# Patient Record
Sex: Female | Born: 1975 | Hispanic: Yes | Marital: Married | Smoking: Never smoker
Health system: Southern US, Community
[De-identification: ages and names within clinical notes are randomized; demographics above are authoritative.]

## PROBLEM LIST (undated history)

## (undated) DIAGNOSIS — N390 Urinary tract infection, site not specified: Secondary | ICD-10-CM

## (undated) DIAGNOSIS — O24419 Gestational diabetes mellitus in pregnancy, unspecified control: Secondary | ICD-10-CM

## (undated) DIAGNOSIS — J40 Bronchitis, not specified as acute or chronic: Secondary | ICD-10-CM

## (undated) HISTORY — PX: TUBAL LIGATION: SHX77

## (undated) HISTORY — DX: Gestational diabetes mellitus in pregnancy, unspecified control: O24.419

## (undated) HISTORY — DX: Urinary tract infection, site not specified: N39.0

---

## 2017-04-07 ENCOUNTER — Emergency Department
Admission: EM | Admit: 2017-04-07 | Discharge: 2017-04-07 | Disposition: A | Discharge status: Home / Self Care | Payer: Self-pay | Source: Home / Self Care | Attending: Family Medicine | Admitting: Family Medicine

## 2017-04-07 DIAGNOSIS — R053 Chronic cough: Secondary | ICD-10-CM

## 2017-04-07 DIAGNOSIS — R05 Cough: Secondary | ICD-10-CM

## 2017-04-07 DIAGNOSIS — R062 Wheezing: Secondary | ICD-10-CM

## 2017-04-07 DIAGNOSIS — J9801 Acute bronchospasm: Secondary | ICD-10-CM

## 2017-04-07 MED ORDER — PREDNISONE 50 MG PO TABS
60.0000 mg | ORAL_TABLET | Freq: Once | ORAL | Status: AC
  Administered 2017-04-07: 60 mg via ORAL

## 2017-04-07 MED ORDER — ALBUTEROL SULFATE HFA 108 (90 BASE) MCG/ACT IN AERS: 1.0000 | INHALATION_SPRAY | Freq: Four times a day (QID) | RESPIRATORY_TRACT | 0 refills | Status: DC | PRN

## 2017-04-07 MED ORDER — IPRATROPIUM-ALBUTEROL 0.5-2.5 (3) MG/3ML IN SOLN
3.0000 mL | Freq: Once | RESPIRATORY_TRACT | Status: AC
  Administered 2017-04-07: 3 mL via RESPIRATORY_TRACT

## 2017-04-07 MED ORDER — AZITHROMYCIN 250 MG PO TABS: 250.0000 mg | ORAL_TABLET | Freq: Every day | ORAL | 0 refills | Status: DC

## 2017-04-07 MED ORDER — PREDNISONE 20 MG PO TABS: ORAL_TABLET | ORAL | 0 refills | Status: DC

## 2017-04-07 NOTE — ED Triage Notes (Signed)
Pt has had a productive cough x 3 weeks.  Stated that what she coughs up is green and brown.  Also is SOB at times.

## 2017-04-07 NOTE — ED Provider Notes (Signed)
CSN: [id number]     Arrival date & time 04/07/17  1926 History   First MD Initiated Contact with Patient 04/07/17 1944     Chief Complaint  Patient presents with  . Cough  . Fatigue   (Consider location/radiation/quality/duration/timing/severity/associated sxs/prior Treatment) HPI  Victoria Russo is a 41 y.o. female presenting to UC with family member with c/o gradually worsening productive cough for 3 weeks.  Sputum production is green/brown in color. Mild intermittent SOB, worse with exertion such as running.  No known hx of asthma. No sick contacts or recent travel. She has not tried any OTC medications. Denies fever, chills, n/v/d.    History reviewed. No pertinent past medical history. History reviewed. No pertinent surgical history. Family History  Problem Relation Age of Onset  . Cancer Mother    Social History  Substance Use Topics  . Smoking status: Never Smoker  . Smokeless tobacco: Never Used  . Alcohol use Yes   OB History    No data available     Review of Systems  Constitutional: Negative for chills and fever.  HENT: Positive for congestion, postnasal drip and rhinorrhea. Negative for ear pain, sore throat, trouble swallowing and voice change.   Respiratory: Positive for cough and chest tightness. Negative for shortness of breath.   Cardiovascular: Negative for chest pain and palpitations.  Gastrointestinal: Negative for abdominal pain, diarrhea, nausea and vomiting.  Musculoskeletal: Negative for arthralgias, back pain and myalgias.  Skin: Negative for rash.    Allergies  Patient has no known allergies.  Home Medications   Prior to Admission medications   Medication Sig Start Date End Date Taking? Authorizing Provider  albuterol (PROVENTIL HFA;VENTOLIN HFA) 108 (90 Base) MCG/ACT inhaler Inhale 1-2 puffs into the lungs every 6 (six) hours as needed for wheezing or shortness of breath. 04/07/17   Junius Finner'Malley, Rissie Sculley, PA-C  azithromycin (ZITHROMAX) 250  MG tablet Take 1 tablet (250 mg total) by mouth daily. Take first 2 tablets together, then 1 every day until finished. 04/07/17   Junius Finner'Malley, Taha Dimond, PA-C  predniSONE (DELTASONE) 20 MG tablet 3 tabs po day one, then 2 po daily x 4 days 04/07/17   Junius Finner'Malley, Consepcion Utt, PA-C   Meds Ordered and Administered this Visit   Medications  ipratropium-albuterol (DUONEB) 0.5-2.5 (3) MG/3ML nebulizer solution 3 mL (3 mLs Nebulization Given 04/07/17 1952)  predniSONE (DELTASONE) tablet 60 mg (60 mg Oral Given 04/07/17 2000)    BP 115/76   Pulse 66   Temp 99.4 F (37.4 C) (Oral)   Wt 164 lb (74.4 kg)   LMP 03/21/2017   SpO2 98%  No data found.   Physical Exam  Constitutional: She is oriented to person, place, and time. She appears well-developed and well-nourished. No distress.  HENT:  Head: Normocephalic and atraumatic.  Right Ear: Tympanic membrane normal.  Left Ear: Tympanic membrane normal.  Nose: Mucosal edema present.  Mouth/Throat: Uvula is midline, oropharynx is clear and moist and mucous membranes are normal.  Eyes: EOM are normal.  Neck: Normal range of motion. Neck supple.  Cardiovascular: Normal rate and regular rhythm.   Pulmonary/Chest: Effort normal. No stridor. No respiratory distress. She has wheezes. She has no rales. She exhibits no tenderness.  Musculoskeletal: Normal range of motion.  Lymphadenopathy:    She has no cervical adenopathy.  Neurological: She is alert and oriented to person, place, and time.  Skin: Skin is warm and dry. She is not diaphoretic.  Psychiatric: She has a normal mood and affect. Her  behavior is normal.  Nursing note and vitals reviewed.   Urgent Care Course     Procedures (including critical care time)  Labs Review Labs Reviewed - No data to display  Imaging Review No results found.    MDM   1. Persistent cough   2. Acute bronchospasm   3. Wheeze    Wheeze noted on exam. Duoneb and Prednisone 60mg  PO given Wheeze resolved  Due to duration  of cough, will cover for bacterial cause. Rx: azithromycin, prednisone and albuterol inhaler   F/u with PCP in 1 week if not improving, sooner if worsening.     Junius Finner, PA-C 04/08/17 [phone]

## 2017-07-19 LAB — CYTOLOGY - PAP

## 2017-07-19 LAB — OB RESULTS CONSOLE HGB/HCT, BLOOD
HCT: 38
HEMOGLOBIN: 13.3

## 2017-07-19 LAB — OB RESULTS CONSOLE GC/CHLAMYDIA
CHLAMYDIA, DNA PROBE: NEGATIVE
Gonorrhea: NEGATIVE

## 2017-07-19 LAB — OB RESULTS CONSOLE HIV ANTIBODY (ROUTINE TESTING): HIV: NONREACTIVE

## 2017-07-19 LAB — OB RESULTS CONSOLE PLATELET COUNT: Platelets: 302

## 2017-07-19 LAB — OB RESULTS CONSOLE ANTIBODY SCREEN: Antibody Screen: NEGATIVE

## 2017-07-19 LAB — OB RESULTS CONSOLE RUBELLA ANTIBODY, IGM: RUBELLA: IMMUNE

## 2017-07-19 LAB — CULTURE, OB URINE: URINE CULTURE, OB: NEGATIVE

## 2017-07-19 LAB — OB RESULTS CONSOLE HEPATITIS B SURFACE ANTIGEN: HEP B S AG: NEGATIVE

## 2017-07-19 LAB — OB RESULTS CONSOLE VARICELLA ZOSTER ANTIBODY, IGG: VARICELLA IGG: IMMUNE

## 2017-07-19 LAB — SICKLE CELL SCREEN: SICKLE CELL SCREEN: NORMAL

## 2017-07-19 LAB — OB RESULTS CONSOLE ABO/RH: RH Type: POSITIVE

## 2017-07-19 LAB — OB RESULTS CONSOLE RPR: RPR: NONREACTIVE

## 2017-07-19 LAB — GLUCOSE TOLERANCE, 1 HOUR: GLUCOSE 1 HOUR: 160

## 2017-07-20 LAB — GLUCOSE, 3 HOUR

## 2017-07-27 ENCOUNTER — Encounter: Payer: Self-pay | Admitting: *Deleted

## 2017-08-02 DIAGNOSIS — O0990 Supervision of high risk pregnancy, unspecified, unspecified trimester: Secondary | ICD-10-CM

## 2017-08-02 DIAGNOSIS — N390 Urinary tract infection, site not specified: Secondary | ICD-10-CM | POA: Insufficient documentation

## 2017-08-02 DIAGNOSIS — O24419 Gestational diabetes mellitus in pregnancy, unspecified control: Secondary | ICD-10-CM | POA: Insufficient documentation

## 2017-08-03 ENCOUNTER — Ambulatory Visit: Payer: Self-pay | Admitting: *Deleted

## 2017-08-03 ENCOUNTER — Encounter: Payer: Self-pay | Attending: Obstetrics and Gynecology | Admitting: *Deleted

## 2017-08-03 DIAGNOSIS — R7302 Impaired glucose tolerance (oral): Secondary | ICD-10-CM | POA: Insufficient documentation

## 2017-08-03 DIAGNOSIS — R7309 Other abnormal glucose: Secondary | ICD-10-CM

## 2017-08-03 DIAGNOSIS — Z713 Dietary counseling and surveillance: Secondary | ICD-10-CM | POA: Insufficient documentation

## 2017-08-03 NOTE — Progress Notes (Signed)
  Patient was seen on 08/03/2017 for Gestational Diabetes self-management .She is here with Spanish interpretor.  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 Diet history obtained. She states no history of GDM. EDD: 02/20/2018. The following learning objectives were met by the patient :   States the definition of Gestational Diabetes  States why dietary management is important in controlling blood glucose  Describes the effects of carbohydrates on blood glucose levels  Demonstrates ability to create a balanced meal plan  Demonstrates carbohydrate counting   States when to check blood glucose levels  Demonstrates proper blood glucose monitoring techniques  States the effect of stress and exercise on blood glucose levels  States the importance of limiting caffeine and abstaining from alcohol and smoking  Plan:  Aim for 3 Carb Choices per meal (45 grams) +/- 1 either way  Aim for 1-2 Carbs per snack Begin reading food labels for Total Carbohydrate of foods Consider  increasing your activity level by walking or other activity daily as tolerated Begin checking BG before breakfast and 2 hours after first bite of breakfast, lunch and dinner as directed by MD  Bring Log Book to every medical appointment   Take medication if directed by MD  Blood glucose monitor given: True Track Lot # [id number] Exp: 03/01/2018 Blood glucose reading: 77 mg/dl  Patient instructed to monitor glucose levels: FBS: 60 - 95 mg/dl 2 hour: <120 mg/dl  Patient received the following handouts:  Nutrition Diabetes and Pregnancy  Carbohydrate Counting List  Patient will be seen for follow-up as needed.

## 2017-08-06 ENCOUNTER — Encounter: Payer: Self-pay | Admitting: *Deleted

## 2017-08-10 ENCOUNTER — Ambulatory Visit (INDEPENDENT_AMBULATORY_CARE_PROVIDER_SITE_OTHER): Payer: Self-pay | Admitting: Family Medicine

## 2017-08-10 ENCOUNTER — Encounter: Payer: Self-pay | Admitting: Family Medicine

## 2017-08-10 VITALS — BP 111/63 | HR 70 | Wt 166.1 lb

## 2017-08-10 DIAGNOSIS — O24410 Gestational diabetes mellitus in pregnancy, diet controlled: Secondary | ICD-10-CM

## 2017-08-10 DIAGNOSIS — O0990 Supervision of high risk pregnancy, unspecified, unspecified trimester: Secondary | ICD-10-CM

## 2017-08-10 DIAGNOSIS — O09529 Supervision of elderly multigravida, unspecified trimester: Secondary | ICD-10-CM | POA: Insufficient documentation

## 2017-08-10 DIAGNOSIS — Z23 Encounter for immunization: Secondary | ICD-10-CM

## 2017-08-10 DIAGNOSIS — O09521 Supervision of elderly multigravida, first trimester: Secondary | ICD-10-CM

## 2017-08-10 DIAGNOSIS — O0992 Supervision of high risk pregnancy, unspecified, second trimester: Secondary | ICD-10-CM

## 2017-08-10 DIAGNOSIS — O34219 Maternal care for unspecified type scar from previous cesarean delivery: Secondary | ICD-10-CM

## 2017-08-10 DIAGNOSIS — O09522 Supervision of elderly multigravida, second trimester: Secondary | ICD-10-CM

## 2017-08-10 NOTE — Progress Notes (Signed)
New OB packet given  Albertina Sen[place] --spanish interpreter

## 2017-08-10 NOTE — Progress Notes (Addendum)
[place] shipped via [employer].  Pt given contact information.  [employer] confirmation # [id number]

## 2017-08-11 ENCOUNTER — Telehealth: Payer: Self-pay | Admitting: General Practice

## 2017-08-11 ENCOUNTER — Encounter: Payer: Self-pay | Admitting: Family Medicine

## 2017-08-11 DIAGNOSIS — O24410 Gestational diabetes mellitus in pregnancy, diet controlled: Secondary | ICD-10-CM

## 2017-08-11 MED ORDER — ASPIRIN EC 81 MG PO TBEC: 81.0000 mg | DELAYED_RELEASE_TABLET | Freq: Every day | ORAL | 3 refills | Status: DC

## 2017-08-11 MED ORDER — PRENATAL VITAMINS 0.8 MG PO TABS: 1.0000 | ORAL_TABLET | Freq: Every day | ORAL | 12 refills | Status: DC

## 2017-08-11 NOTE — Progress Notes (Signed)
   PRENATAL VISIT NOTE  Subjective:  Victoria Russo is a 41 y.o. [phone] at 81w3dbeing [facility] today [facility]  She is currently monitored for the following issues for this high-risk pregnancy and has Gestational diabetes mellitus (GDM); Supervision of high risk pregnancy, antepartum; UTI (urinary tract infection); AMA (advanced maternal age) multigravida 35+; and History of cesarean delivery, antepartum on her problem list.  Patient reports no complaints.  Contractions: Not present. Vag. Bleeding: None.   . Denies leaking of fluid.   The following portions of the patient's history were reviewed and updated as appropriate: allergies, current medications, past family history, past medical history, past social history, past surgical history and problem list. Problem list updated.  Objective:   Vitals:   08/10/17 1547  BP: 111/63  Pulse: 70  Weight: 166 lb 1.6 oz (75.3 kg)    Fetal Status: Fetal Heart Rate (bpm): 145         General:  Alert, oriented and cooperative. Patient is in no acute distress.  Skin: Skin is warm and dry. No rash noted.   Cardiovascular: Normal heart rate noted  Respiratory: Normal respiratory effort, no problems with respiration noted  Abdomen: Soft, gravid, appropriate for gestational age.  Pain/Pressure: Absent     Pelvic: Cervical exam deferred        Extremities: Normal range of motion.  Edema: None  Mental Status:  Normal mood and affect. Normal behavior. Normal judgment and thought content.   Assessment and Plan:  Pregnancy: [id number]. Supervision of high risk pregnancy, antepartum Labs and record reviewed  2. Elderly multigravida in first trimester For NIPS - Genetic Screening  3. History of cesarean delivery, antepartum X2--for elective repeat  4. Diet controlled gestational diabetes mellitus (GDM) in second trimester Baseline labs Has met with Diabetes and Nutrition will  check BS Follow diet - Hemoglobin A1c - Protein, urine, 24 hour - TSH - Comprehensive metabolic panel -ASA 81 mg po daily  5. Needs flu shot - Flu Vaccine QUAD 36+ mos IM  General obstetric precautions including but not limited to vaginal bleeding, contractions, leaking of fluid and fetal movement were reviewed in detail with the patient. Please refer to After Visit Summary for other counseling recommendations.    TaDonnamae JudeMD

## 2017-08-11 NOTE — Patient Instructions (Signed)
 Lactancia materna (Breastfeeding) Decidir amamantar es una de las mejores elecciones que puede hacer por usted y su beb. El cambio hormonal durante el embarazo produce el desarrollo del tejido mamario y aumenta la cantidad y el tamao de los conductos galactforos. Estas hormonas tambin permiten que las protenas, los azcares y las grasas de la sangre produzcan la leche materna en las glndulas productoras de leche. Las hormonas impiden que la leche materna sea liberada antes del nacimiento del beb, adems de impulsar el flujo de leche luego del nacimiento. Una vez que ha comenzado a amamantar, pensar en el beb, as como la succin o el llanto, pueden estimular la liberacin de leche de las glndulas productoras de leche. LOS BENEFICIOS DE AMAMANTAR Para el beb  La primera leche (calostro) ayuda a mejorar el funcionamiento del sistema digestivo del beb.  La leche tiene anticuerpos que ayudan a prevenir las infecciones en el beb.  El beb tiene una menor incidencia de asma, alergias y del sndrome de muerte sbita del lactante.  Los nutrientes en la leche materna son mejores para el beb que la leche maternizada y estn preparados exclusivamente para cubrir las necesidades del beb.  La leche materna mejora el desarrollo cerebral del beb.  Es menos probable que el beb desarrolle otras enfermedades, como obesidad infantil, asma o diabetes mellitus de tipo 2. Para usted  La lactancia materna favorece el desarrollo de un vnculo muy especial entre la madre y el beb.  Es conveniente. La leche materna siempre est disponible a la temperatura correcta y es econmica.  La lactancia materna ayuda a quemar caloras y a perder el peso ganado durante el embarazo.  Favorece la contraccin del tero al tamao que tena antes del embarazo de manera ms rpida y disminuye el sangrado (loquios) despus del parto.  La lactancia materna contribuye a reducir el riesgo de desarrollar diabetes  mellitus de tipo 2, osteoporosis o cncer de mama o de ovario en el futuro. SIGNOS DE QUE EL BEB EST HAMBRIENTO Primeros signos de hambre  Aumenta su estado de alerta o actividad.  Se estira.  Mueve la cabeza de un lado a otro.  Mueve la cabeza y abre la boca cuando se le toca la mejilla o la comisura de la boca (reflejo de bsqueda).  Aumenta las vocalizaciones, tales como sonidos de succin, se relame los labios, emite arrullos, suspiros, o chirridos.  Mueve la mano hacia la boca.  Se chupa con ganas los dedos o las manos. Signos tardos de hambre  Est agitado.  Llora de manera intermitente. Signos de hambre extrema Los signos de hambre extrema requerirn que lo calme y lo consuele antes de que el beb pueda alimentarse adecuadamente. No espere a que se manifiesten los siguientes signos de hambre extrema para comenzar a amamantar:  Agitacin.  Llanto intenso y fuerte.  Gritos. INFORMACIN BSICA SOBRE LA LACTANCIA MATERNA Iniciacin de la lactancia materna  Encuentre un lugar cmodo para sentarse o acostarse, con un buen respaldo para el cuello y la espalda.  Coloque una almohada o una manta enrollada debajo del beb para acomodarlo a la altura de la mama (si est sentada). Las almohadas para amamantar se han diseado especialmente a fin de servir de apoyo para los brazos y el beb mientras amamanta.  Asegrese de que el abdomen del beb est frente al suyo.  Masajee suavemente la mama. Con las yemas de los dedos, masajee la pared del pecho hacia el pezn en un movimiento circular. Esto estimula el   flujo de leche. Es posible que deba continuar este movimiento mientras amamanta si la leche fluye lentamente.  Sostenga la mama con el pulgar por arriba del pezn y los otros 4 dedos por debajo de la mama. Asegrese de que los dedos se encuentren lejos del pezn y de la boca del beb.  Empuje suavemente los labios del beb con el pezn o con el dedo.  Cuando la boca del  beb se abra lo suficiente, acrquelo rpidamente a la mama e introduzca todo el pezn y la zona oscura que lo rodea (areola), tanto como sea posible, dentro de la boca del beb. ? Debe haber ms areola visible por arriba del labio superior del beb que por debajo del labio inferior. ? La lengua del beb debe estar entre la enca inferior y la mama.  Asegrese de que la boca del beb est en la posicin correcta alrededor del pezn (prendida). Los labios del beb deben crear un sello sobre la mama y estar doblados hacia afuera (invertidos).  Es comn que el beb succione durante 2 a 3 minutos para que comience el flujo de leche materna. Cmo debe prenderse Es muy importante que le ensee al beb cmo prenderse adecuadamente a la mama. Si el beb no se prende adecuadamente, puede causarle dolor en el pezn y reducir la produccin de leche materna, y hacer que el beb tenga un escaso aumento de peso. Adems, si el beb no se prende adecuadamente al pezn, puede tragar aire durante la alimentacin. Esto puede causarle molestias al beb. Hacer eructar al beb al cambiar de mama puede ayudarlo a liberar el aire. Sin embargo, ensearle al beb cmo prenderse a la mama adecuadamente es la mejor manera de evitar que se sienta molesto por tragar aire mientras se alimenta. Signos de que el beb se ha prendido adecuadamente al pezn:  Tironea o succiona de modo silencioso, sin causarle dolor.  Se escucha que traga cada 3 o 4 succiones.  Hay movimientos musculares por arriba y por delante de sus odos al succionar. Signos de que el beb no se ha prendido adecuadamente al pezn:  Hace ruidos de succin o de chasquido mientras se alimenta.  Siente dolor en el pezn. Si cree que el beb no se prendi correctamente, deslice el dedo en la comisura de la boca y colquelo entre las encas del beb para interrumpir la succin. Intente comenzar a amamantar nuevamente. Signos de lactancia materna exitosa Signos del  beb:  Disminuye gradualmente el nmero de succiones o cesa la succin por completo.  Se duerme.  Relaja el cuerpo.  Retiene una pequea cantidad de leche en la boca.  Se desprende solo del pecho. Signos que presenta usted:  Las mamas han aumentado la firmeza, el peso y el tamao 1 a 3 horas despus de amamantar.  Estn ms blandas inmediatamente despus de amamantar.  Un aumento del volumen de leche, y tambin un cambio en su consistencia y color se producen hacia el quinto da de lactancia materna.  Los pezones no duelen, ni estn agrietados ni sangran. Signos de que su beb recibe la cantidad de leche suficiente  Mojar por lo menos 1 o 2 paales durante las primeras 24 horas despus del nacimiento.  Mojar por lo menos 5 o 6 paales cada 24 horas durante la primera semana despus del nacimiento. La orina debe ser transparente o de color amarillo plido a los 5 das despus del nacimiento.  Mojar entre 6 y 8 paales cada 24 horas a medida   que el beb sigue creciendo y desarrollndose.  Defeca al menos 3 veces en 24 horas a los 5 das de vida. La materia fecal debe ser blanda y amarillenta.  Defeca al menos 3 veces en 24 horas a los 7 das de vida. La materia fecal debe ser grumosa y amarillenta.  No registra una prdida de peso mayor del 10% del peso al nacer durante los primeros 3 das de vida.  Aumenta de peso un promedio de 4 a 7onzas (113 a 198g) por semana despus de los 4 das de vida.  Aumenta de peso, diariamente, de manera uniforme a partir de los 5 das de vida, sin registrar prdida de peso despus de las 2semanas de vida. Despus de alimentarse, es posible que el beb regurgite una pequea cantidad. Esto es frecuente. FRECUENCIA Y DURACIN DE LA LACTANCIA MATERNA El amamantamiento frecuente la ayudar a producir ms leche y a prevenir problemas de dolor en los pezones e hinchazn en las mamas. Alimente al beb cuando muestre signos de hambre o si siente la  necesidad de reducir la congestin de las mamas. Esto se denomina "lactancia a demanda". Evite el uso del chupete mientras trabaja para establecer la lactancia (las primeras 4 a 6 semanas despus del nacimiento del beb). Despus de este perodo, podr ofrecerle un chupete. Las investigaciones demostraron que el uso del chupete durante el primer ao de vida del beb disminuye el riesgo de desarrollar el sndrome de muerte sbita del lactante (SMSL). Permita que el nio se alimente en cada mama todo lo que desee. Contine amamantando al beb hasta que haya terminado de alimentarse. Cuando el beb se desprende o se queda dormido mientras se est alimentando de la primera mama, ofrzcale la segunda. Debido a que, con frecuencia, los recin nacidos permanecen somnolientos las primeras semanas de vida, es posible que deba despertar al beb para alimentarlo. Los horarios de lactancia varan de un beb a otro. Sin embargo, las siguientes reglas pueden servir como gua para ayudarla a garantizar que el beb se alimenta adecuadamente:  Se puede amamantar a los recin nacidos (bebs de 4 semanas o menos de vida) cada 1 a 3 horas.  No deben transcurrir ms de 3 horas durante el da o 5 horas durante la noche sin que se amamante a los recin nacidos.  Debe amamantar al beb 8 veces como mnimo en un perodo de 24 horas, hasta que comience a introducir slidos en su dieta, a los 6 meses de vida aproximadamente. EXTRACCIN DE LECHE MATERNA La extraccin y el almacenamiento de la leche materna le permiten asegurarse de que el beb se alimente exclusivamente de leche materna, aun en momentos en los que no puede amamantar. Esto tiene especial importancia si debe regresar al trabajo en el perodo en que an est amamantando o si no puede estar presente en los momentos en que el beb debe alimentarse. Su asesor en lactancia puede orientarla sobre cunto tiempo es seguro almacenar leche materna. El sacaleche es un aparato  que le permite extraer leche de la mama a un recipiente estril. Luego, la leche materna extrada puede almacenarse en un refrigerador o congelador. Algunos sacaleches son manuales, mientras que otros son elctricos. Consulte a su asesor en lactancia qu tipo ser ms conveniente para usted. Los sacaleches se pueden comprar; sin embargo, algunos hospitales y grupos de apoyo a la lactancia materna alquilan sacaleches mensualmente. Un asesor en lactancia puede ensearle cmo extraer leche materna manualmente, en caso de que prefiera no usar un sacaleche.   CMO CUIDAR LAS MAMAS DURANTE LA LACTANCIA MATERNA Los pezones se secan, agrietan y duelen durante la lactancia materna. Las siguientes recomendaciones pueden ayudarla a mantener las mamas humectadas y sanas:  Evite usar jabn en los pezones.  Use un sostn de soporte. Aunque no son esenciales, las camisetas sin mangas o los sostenes especiales para amamantar estn diseados para acceder fcilmente a las mamas, para amamantar sin tener que quitarse todo el sostn o la camiseta. Evite usar sostenes con aro o sostenes muy ajustados.  Seque al aire sus pezones durante 3 a 4minutos despus de amamantar al beb.  Utilice solo apsitos de algodn en el sostn para absorber las prdidas de leche. La prdida de un poco de leche materna entre las tomas es normal.  Utilice lanolina sobre los pezones luego de amamantar. La lanolina ayuda a mantener la humedad normal de la piel. Si usa lanolina pura, no tiene que lavarse los pezones antes de volver a alimentar al beb. La lanolina pura no es txica para el beb. Adems, puede extraer manualmente algunas gotas de leche materna y masajear suavemente esa leche sobre los pezones, para que la leche se seque al aire. Durante las primeras semanas despus de dar a luz, algunas mujeres pueden experimentar hinchazn en las mamas (congestin mamaria). La congestin puede hacer que sienta las mamas pesadas, calientes y  sensibles al tacto. El pico de la congestin ocurre dentro de los 3 a 5 das despus del parto. Las siguientes recomendaciones pueden ayudarla a aliviar la congestin:  Vace por completo las mamas al amamantar o extraer leche. Puede aplicar calor hmedo en las mamas (en la ducha o con toallas hmedas para manos) antes de amamantar o extraer leche. Esto aumenta la circulacin y ayuda a que la leche fluya. Si el beb no vaca por completo las mamas cuando lo amamanta, extraiga la leche restante despus de que haya finalizado.  Use un sostn ajustado (para amamantar o comn) o una camiseta sin mangas durante 1 o 2 das para indicar al cuerpo que disminuya ligeramente la produccin de leche.  Aplique compresas de hielo sobre las mamas, a menos que le resulte demasiado incmodo.  Asegrese de que el beb est prendido y se encuentre en la posicin correcta mientras lo alimenta. Si la congestin persiste luego de 48 horas o despus de seguir estas recomendaciones, comunquese con su mdico o un asesor en lactancia. RECOMENDACIONES GENERALES PARA EL CUIDADO DE LA SALUD DURANTE LA LACTANCIA MATERNA  Consuma alimentos saludables. Alterne comidas y colaciones, y coma 3 de cada una por da. Dado que lo que come afecta la leche materna, es posible que algunas comidas hagan que su beb se vuelva ms irritable de lo habitual. Evite comer este tipo de alimentos si percibe que afectan de manera negativa al beb.  Beba leche, jugos de fruta y agua para satisfacer su sed (aproximadamente 10 vasos al da).  Descanse con frecuencia, reljese y tome sus vitaminas prenatales para evitar la fatiga, el estrs y la anemia.  Contine con los autocontroles de la mama.  Evite masticar y fumar tabaco. Las sustancias qumicas de los cigarrillos que pasan a la leche materna y la exposicin al humo ambiental del tabaco pueden daar al beb.  No consuma alcohol ni drogas, incluida la marihuana. Algunos medicamentos, que  pueden ser perjudiciales para el beb, pueden pasar a travs de la leche materna. Es importante que consulte a su mdico antes de tomar cualquier medicamento, incluidos todos los medicamentos recetados y de venta   libre, as como los suplementos vitamnicos y herbales. Puede quedar embarazada durante la lactancia. Si desea controlar la natalidad, consulte a su mdico cules son las opciones ms seguras para el beb. SOLICITE ATENCIN MDICA SI:  Usted siente que quiere dejar de amamantar o se siente frustrada con la lactancia.  Siente dolor en las mamas o en los pezones.  Sus pezones estn agrietados o sangran.  Sus pechos estn irritados, sensibles o calientes.  Tiene un rea hinchada en cualquiera de las mamas.  Siente escalofros o fiebre.  Tiene nuseas o vmitos.  Presenta una secrecin de otro lquido distinto de la leche materna de los pezones.  Sus mamas no se llenan antes de amamantar al beb para el quinto da despus del parto.  Se siente triste y deprimida.  El beb est demasiado somnoliento como para comer bien.  El beb tiene problemas para dormir.  Moja menos de 3 paales en 24 horas.  Defeca menos de 3 veces en 24 horas.  La piel del beb o la parte blanca de los ojos se vuelven amarillentas.  El beb no ha aumentado de peso a los 5 das de vida.  SOLICITE ATENCIN MDICA DE INMEDIATO SI:  El beb est muy cansado (letargo) y no se quiere despertar para comer.  Le sube la fiebre sin causa.  Esta informacin no tiene como fin reemplazar el consejo del mdico. Asegrese de hacerle al mdico cualquier pregunta que tenga. Document Released: 10/19/2005 Document Revised: 02/10/2016 Document Reviewed: 04/12/2013 Elsevier Interactive Patient Education  2017 [employer]  

## 2017-08-11 NOTE — Telephone Encounter (Signed)
Called patient with [facility]so for interpreter and informed her of Rx and recommendation. Patient verbalized understanding and asked for Rx for PNV. Told patient we will send that to her pharmacy as well. Patient verbalized understanding & had no questions

## 2017-08-11 NOTE — Telephone Encounter (Signed)
-----   Message from Reva Bores, MD sent at 08/11/2017  2:10 PM EDT ----- Please call and inform patient that she has an rx at her pharmacy for ASA to take daily due to diabetes in pregnancy and it is used to decrease the chance of HTN in pregnancy.

## 2017-08-12 ENCOUNTER — Other Ambulatory Visit: Payer: Self-pay

## 2017-08-25 ENCOUNTER — Ambulatory Visit (INDEPENDENT_AMBULATORY_CARE_PROVIDER_SITE_OTHER): Payer: Self-pay | Admitting: Family Medicine

## 2017-08-25 ENCOUNTER — Telehealth: Payer: Self-pay | Admitting: General Practice

## 2017-08-25 ENCOUNTER — Encounter: Payer: Self-pay | Admitting: General Practice

## 2017-08-25 ENCOUNTER — Encounter: Payer: Self-pay | Admitting: *Deleted

## 2017-08-25 DIAGNOSIS — O351XX0 Maternal care for (suspected) chromosomal abnormality in fetus, not applicable or unspecified: Secondary | ICD-10-CM | POA: Insufficient documentation

## 2017-08-25 DIAGNOSIS — O289 Unspecified abnormal findings on antenatal screening of mother: Secondary | ICD-10-CM

## 2017-08-25 DIAGNOSIS — O24410 Gestational diabetes mellitus in pregnancy, diet controlled: Secondary | ICD-10-CM

## 2017-08-25 DIAGNOSIS — O3513X0 Maternal care for (suspected) chromosomal abnormality in fetus, trisomy 21, not applicable or unspecified: Secondary | ICD-10-CM | POA: Insufficient documentation

## 2017-08-25 DIAGNOSIS — O285 Abnormal chromosomal and genetic finding on antenatal screening of mother: Secondary | ICD-10-CM

## 2017-08-25 NOTE — Telephone Encounter (Signed)
Called Dr Shawnie PonsPratt regarding panorama results, who advises for patient to come in for results & appt with genetic counseling. Scheduled appt in MFM for tomorrow 10/25 @ 10am. Called patient with pacific interpreter [phone] and informed patient that the genetic blood test that was done at her first visit with us had an elevated genetic risk and we would like for her to come in and see the doctor today so she can talk to her about these results today at 2pm. Told patient we also have an appt scheduled for her tomorrow as well to see a MFM doctor at 10am. Patient verbalized understanding to all and had no questions

## 2017-08-25 NOTE — Progress Notes (Signed)
   PRENATAL VISIT NOTE  Subjective:  Victoria Russo is a 41 y.o. G4P1003 at 7167w4d being seen today for ongoing prenatal care.  She is currently monitored for the following issues for this high-risk pregnancy and has Gestational diabetes mellitus (GDM); Supervision of high risk pregnancy, antepartum; UTI (urinary tract infection); AMA (advanced maternal age) multigravida 35+; History of cesarean delivery, antepartum; and Abnormal genetic test-high risk of Trisomy 21 on NIPT on her problem list.  Patient reports no complaints.   .  .   . Denies leaking of fluid.   The following portions of the patient's history were reviewed and updated as appropriate: allergies, current medications, past family history, past medical history, past social history, past surgical history and problem list. Problem list updated.  Objective:  There were no vitals filed for this visit.  Fetal Status:           General:  Alert, oriented and cooperative. Patient is in no acute distress.  Skin: Skin is warm and dry. No rash noted.   Cardiovascular: Normal heart rate noted  Respiratory: Normal respiratory effort, no problems with respiration noted  Abdomen: Soft, gravid, appropriate for gestational age.        Pelvic: Cervical exam deferred        Extremities: Normal range of motion.     Mental Status:  Normal mood and affect. Normal behavior. Normal judgment and thought content.  FBS 83-102 2 hour pp 68-148 (5 of 25 out of range) Assessment and Plan:  Pregnancy: G4P1003 at 967w4d  1. Abnormal genetic test-high risk of Trisomy 21 on NIPT Discussed results and outcomes and screening nature of test-has GC scheduled for tomorrow.  2. Diet controlled gestational diabetes mellitus (GDM) in second trimester Continue diet and exercise--discussed at length Needs more strips Baseline labs needed - Hemoglobin A1c - Comprehensive metabolic panel - TSH  General obstetric precautions including but not limited  to vaginal bleeding, contractions, leaking of fluid and fetal movement were reviewed in detail with the patient. Please refer to After Visit Summary for other counseling recommendations.  No Follow-up on file.   Victoria Boresanya S Angelgabriel Willmore, MD

## 2017-08-26 ENCOUNTER — Ambulatory Visit (HOSPITAL_COMMUNITY)
Admission: RE | Admit: 2017-08-26 | Discharge: 2017-08-26 | Disposition: A | Discharge status: Home / Self Care | Payer: Self-pay | Source: Ambulatory Visit | Attending: Family Medicine | Admitting: Family Medicine

## 2017-08-26 ENCOUNTER — Other Ambulatory Visit (HOSPITAL_COMMUNITY): Payer: Self-pay | Admitting: *Deleted

## 2017-08-26 DIAGNOSIS — O285 Abnormal chromosomal and genetic finding on antenatal screening of mother: Secondary | ICD-10-CM

## 2017-08-26 DIAGNOSIS — O09521 Supervision of elderly multigravida, first trimester: Secondary | ICD-10-CM

## 2017-08-26 DIAGNOSIS — Z3A14 14 weeks gestation of pregnancy: Secondary | ICD-10-CM | POA: Insufficient documentation

## 2017-08-26 LAB — HEMOGLOBIN A1C
ESTIMATED AVERAGE GLUCOSE: 105 mg/dL
HEMOGLOBIN A1C: 5.3 % (ref 4.8–5.6)

## 2017-08-26 LAB — COMPREHENSIVE METABOLIC PANEL
A/G RATIO: 1.4 (ref 1.2–2.2)
ALT: 20 IU/L (ref 0–32)
AST: 21 IU/L (ref 0–40)
Albumin: 3.7 g/dL (ref 3.5–5.5)
Alkaline Phosphatase: 89 IU/L (ref 39–117)
BUN/Creatinine Ratio: 15 (ref 9–23)
BUN: 8 mg/dL (ref 6–20)
CALCIUM: 9.5 mg/dL (ref 8.7–10.2)
CHLORIDE: 102 mmol/L (ref 96–106)
CO2: 22 mmol/L (ref 20–29)
Creatinine, Ser: 0.54 mg/dL — ABNORMAL LOW (ref 0.57–1.00)
GFR, EST AFRICAN AMERICAN: 137 mL/min/{1.73_m2} (ref >59)
GFR, EST NON AFRICAN AMERICAN: 119 mL/min/{1.73_m2} (ref >59)
GLOBULIN, TOTAL: 2.6 g/dL (ref 1.5–4.5)
Glucose: 145 mg/dL — ABNORMAL HIGH (ref 65–99)
POTASSIUM: 4 mmol/L (ref 3.5–5.2)
SODIUM: 139 mmol/L (ref 134–144)
Total Protein: 6.3 g/dL (ref 6.0–8.5)

## 2017-08-26 LAB — TSH: TSH: 0.43 u[IU]/mL — ABNORMAL LOW (ref 0.450–4.500)

## 2017-08-26 NOTE — Progress Notes (Signed)
Genetic Counseling  High-Risk Gestation Note  Appointment Date:  08/26/2017 Referred By: Reva BoresPratt, Tanya S, MD Date of Birth:  Aug 31, 1976   Pregnancy History: G4P1003 Estimated Date of Delivery: 02/20/18 Estimated Gestational Age: 422w4d Attending: Darlyn ReadEmily Bunce, MD   Ms. Victoria Russo was seen for genetic counseling because of a screen positive Down syndrome risk on noninvasive prenatal screening (NIPS)/prenatal cell free DNA testing. She will be 41 years old at delivery. [facility]English medical interpreter, Frutoso ChaseBetty Shirley, provided interpretation for today's visit.   In summary:  Discussed AMA and associated risk for fetal aneuploidy  Reviewed results of noninvasive prenatal screening (NIPS)/[facility]  Trisomy 21 risk   PPV= 93%  Given high suspicion for Down syndrome, reviewed the condition and variable features   Provided the patient with written resources (in [place]) regarding Down syndrome  Discussed options for additional screening  Ultrasound  Fetal Echocardiogram- to be scheduled at a later date  Discussed diagnostic testing options  Amniocentesis- patient currently undecided; may consider pursuing amnio at time of detailed ultrasound  Postnatal chromosome analysis   Reviewed family history concerns  Ms. Victoria Russo was offered noninvasive prenatal screening (NIPS)/prenatal cell free DNA testing through her OB provider because of advanced maternal age. Specifically, she [facility], and the results are screen positive/high risk Trisomy 21 (Down syndrome), reported as a 9 in 10 risk.   She was counseled regarding maternal age and the association with risk for chromosome conditions due to nondisjunction with aging of the ova.   We reviewed chromosomes, nondisjunction, and the associated risk for fetal aneuploidy related to a maternal age of 41 y.o. at 5422w4d gestation.  She was counseled  that the risk for aneuploidy decreases as gestational age increases, accounting for those pregnancies which spontaneously abort.   We discussed that NIPS testing identifies >99% of pregnancies with Down syndrome with a false positive rate of 0.1%. We reviewed that this testing is highly sensitive and specific, but is not considered diagnostic. We reviewed that the positive predictive value (PPV) is the percentage of those patients who have a positive NIPS result who actually have fetal Down syndrome. Specific to this laboratory and the patient's age, the PPV for Down syndrome in the current pregnancy is approximately 93%. We spent significant time reviewing this technology and the accuracy of NIPS and other screening versus diagnostic tests.  We reviewed that the cell free DNA test can not distinguish between aneuploidy confined to the placenta, trisomy 21, translocation 21, or mosaic trisomy 21.   We discussed the availability of amniocentesis or peripheral blood chromosome analysis for confirmation of the diagnosis. We discussed the risks, benefits, and limitations of amniocentesis. We discussed the associated 1 in 300-500 risk for complications including spontaneous pregnancy loss. We discussed the possible results that the tests might provide including: positive, negative, unanticipated, and no result. Ms. [name] stated that she is undecided regarding whether or not she would like to pursue amniocentesis in the pregnancy. She may possibly consider this at the time of her detailed ultrasound in the second trimester but stated that she needs additional time to think through this option.    Considering the very high suspicion of Down syndrome, she was counseled in detail regarding this diagnosis. We discussed that there are different types of Down syndrome, and each type is determined by the arrangement of the #21 chromosomes. Approximately 95% of cases of Down syndrome are trisomy 21 and 2-4% are  due to a translocation  involving chromosome 21. We reviewed chromosomes, nondisjunction, and that chromosome division errors happen by chance and are not usually inherited. She understands that confirmatory testing will provide an actual karyotype and will help to determine accurate risks of recurrence for a future pregnancy.   Ms. [name] was counseled that Down syndrome occurs once per every 800 births and is associated with specific features. However, there is a high degree of variability seen among children who have this condition, meaning that every child with Down syndrome will not be affected in exactly the same way, and some children will have more or less features than others. Ms. [name] understands that chromosome analysis is available to confirm the presence of Down syndrome but that this does not predict specific features of the condition that may or may not be present for the individual.  She was counseled that half of individuals with Down syndrome have a cardiac anomaly and ~10-15% have an intestinal difference. Other anomalies of various organ systems have also been described in association with this diagnosis. Given these associations, we discussed that detailed ultrasound is available in the second trimester and fetal echocardiogram is available to assess fetal heart in more detail in the second trimester. We discussed that ultrasound cannot diagnose or rule out all birth defects or genetic conditions.   We discussed that in general most individuals with Down syndrome have mild to moderate intellectual disability and likely will require extra assistance with school work. Approximately 70-80% of children with Down syndrome have hypotonia which may lead to delays in sitting, walking, and talking. Hypotonia does tend to improve with age and early intervention services such as physical, occupational, and speech therapies can help with achievement of developmental  milestones. We discussed that these therapies are typically provided to children (with qualifying diagnoses) from birth until age 19. We also discussed that Down syndrome is associated with characteristic facial features. Because of these facial features, many children with Down syndrome look similar to each other, but they were reminded that each child with Down syndrome is unique and will have many more features in common with his or her own family members.   We also reviewed that the AAP have established health supervision guidelines for individuals with Down syndrome. These guidelines provide medical management recommendations for various stages of life and can be a resource for families who have a child with Down syndrome as well as for health professionals. We also discussed that providing children with Down syndrome with a stimulating physical and social environment, as well as ensuring that the child receives adequate medical care and appropriate therapies, will help these children to reach their full potential. With the advances in medical technology, early intervention, and supportive therapies, many individuals with Down syndrome are able to live with an increasing degree of independence. Today, many adults with Down syndrome care for themselves, have jobs, and often times live in group homes or apartments where assistance is available if needed.   We discussed local and national support organizations and provided the patient with Spanish language written resources regarding Down syndrome. Additionally, we discussed that postnatal health management can be coordinated by a medical geneticist as well as with a multidisciplinary team of physicians (Down syndrome clinic).   In the case of a confirmed diagnosis of Down syndrome in pregnancy, we discussed that options including termination of pregnancy, adoption, and continuing the pregnancy. The patient stated that she plans to continue the pregnancy and  is not considering termination nor adoption.  Both family histories were reviewed and found to be contributory for a nephew to the patient (her maternal half-sister's son) with a learning disability of unknown etiology. Additionally, Ms. Russo reported a maternal first cousin with Down syndrome. Information regarding his karyotype was not known, and he died at age 83 years old due to an accident. We discussed that 95% of cases of Down syndrome are not inherited and are the result of non-disjunction.  Three to 4% of cases of Down syndrome are the result of a translocation involving chromosome #21.  We discussed the option of chromosome analysis to determine if an individual is a carrier of a balanced translocation involving chromosome #21.  If an individual carries a balanced translocation involving chromosome #21, then the chance to have a baby with Down syndrome would be greater than the maternal age-related risk. The reported family history is most suggestive of sporadically occurring Down syndrome. However, additional information regarding the etiology may alter recurrence risk.  Ms. Lemieux had very limited information regarding the father of the pregnancy and his family history. We thus cannot assess how his family history may impact the risk for birth defects or genetic conditions in the pregnancy. Consanguinity was denied. Without further information regarding the provided family history, an accurate genetic risk cannot be calculated. Further genetic counseling is warranted if more information is obtained.   Ms. Peckenpaugh denied exposure to environmental toxins or chemical agents. She denied the use of alcohol, tobacco or street drugs. She denied significant viral illnesses during the course of her pregnancy.   I counseled Ms. Victoria Friar regarding the above risks and available options.  The approximate face-to-face time with the genetic counselor was 60  minutes.  Quinn Plowman, MS,  Certified Genetic Counselor 08/26/2017

## 2017-09-09 ENCOUNTER — Encounter: Payer: Self-pay | Admitting: Obstetrics and Gynecology

## 2017-09-09 ENCOUNTER — Ambulatory Visit (INDEPENDENT_AMBULATORY_CARE_PROVIDER_SITE_OTHER): Payer: Self-pay | Admitting: Obstetrics and Gynecology

## 2017-09-09 VITALS — BP 110/62 | HR 80 | Wt 167.8 lb

## 2017-09-09 DIAGNOSIS — O0990 Supervision of high risk pregnancy, unspecified, unspecified trimester: Secondary | ICD-10-CM

## 2017-09-09 DIAGNOSIS — R7989 Other specified abnormal findings of blood chemistry: Secondary | ICD-10-CM

## 2017-09-09 DIAGNOSIS — O285 Abnormal chromosomal and genetic finding on antenatal screening of mother: Secondary | ICD-10-CM

## 2017-09-09 DIAGNOSIS — O24415 Gestational diabetes mellitus in pregnancy, controlled by oral hypoglycemic drugs: Secondary | ICD-10-CM

## 2017-09-09 DIAGNOSIS — O09521 Supervision of elderly multigravida, first trimester: Secondary | ICD-10-CM

## 2017-09-09 DIAGNOSIS — O34219 Maternal care for unspecified type scar from previous cesarean delivery: Secondary | ICD-10-CM

## 2017-09-09 MED ORDER — GLYBURIDE 2.5 MG PO TABS: ORAL_TABLET | ORAL | 3 refills | Status: DC

## 2017-09-09 NOTE — Progress Notes (Signed)
Spanish Interpreter Victoria BreslowCarol Russo  Pt reports declining the amniocentesis  Educated pt on Breastfeeding benefits for [facility]

## 2017-09-09 NOTE — Progress Notes (Signed)
Subjective:  Victoria Russo is a 41 y.o. G4P1003 at 2838w4d being seen today for ongoing prenatal care.  She is currently monitored for the following issues for this high-risk pregnancy and has Gestational diabetes mellitus (GDM); Supervision of high risk pregnancy, antepartum; AMA (advanced maternal age) multigravida 35+; History of cesarean delivery, antepartum; and Abnormal genetic test-high risk of Trisomy 21 on NIPT on their problem list.  Patient reports no complaints.  Contractions: Not present. Vag. Bleeding: None.  Movement: Present. Denies leaking of fluid.   The following portions of the patient's history were reviewed and updated as appropriate: allergies, current medications, past family history, past medical history, past social history, past surgical history and problem list. Problem list updated.  Objective:   Vitals:   09/09/17 0857  BP: 110/62  Pulse: 80  Weight: 76.1 kg (167 lb 12.8 oz)    Fetal Status: Fetal Heart Rate (bpm): 158   Movement: Present     General:  Alert, oriented and cooperative. Patient is in no acute distress.  Skin: Skin is warm and dry. No rash noted.   Cardiovascular: Normal heart rate noted  Respiratory: Normal respiratory effort, no problems with respiration noted  Abdomen: Soft, gravid, appropriate for gestational age. Pain/Pressure: Absent     Pelvic:  Cervical exam deferred        Extremities: Normal range of motion.  Edema: None  Mental Status: Normal mood and affect. Normal behavior. Normal judgment and thought content.   Urinalysis:      Assessment and Plan:  Pregnancy: G4P1003 at 3738w4d  1. Abnormal thyroid stimulating hormone (TSH) level  - T3, free - T4, free  2. Supervision of high risk pregnancy, antepartum Stable  Anatomy scan 09/22/17  3. Gestational diabetes mellitus (GDM) in second trimester controlled on oral hypoglycemic drug BS not completely in goal range Some are diet choices and diet is reviewed with  pt Will start glyburide at bedtime  - glyBURIDE (DIABETA) 2.5 MG tablet; Take 1 tablet at bedtime nightly  Dispense: 60 tablet; Refill: 3  4. Elderly multigravida in first trimester See # 5  5. Abnormal genetic test-high risk of Trisomy 21 on NIPT Declines amino and or futher genetic testing Has seen genetic counslor  6. History of cesarean delivery, antepartum C/S x 2  Will need to discuss delivery plans at future OB visits  Interrupter used for today's visit  Preterm labor symptoms and general obstetric precautions including but not limited to vaginal bleeding, contractions, leaking of fluid and fetal movement were reviewed in detail with the patient. Please refer to After Visit Summary for other counseling recommendations.  Return in about 4 weeks (around 10/07/2017) for OB visit.   Hermina StaggersErvin, Charlita Brian L, MD

## 2017-09-10 LAB — T4, FREE: Free T4: 0.94 ng/dL (ref 0.82–1.77)

## 2017-09-10 LAB — T3, FREE: T3, Free: 3.5 pg/mL (ref 2.0–4.4)

## 2017-09-15 ENCOUNTER — Encounter (HOSPITAL_COMMUNITY): Payer: Self-pay | Admitting: Family Medicine

## 2017-09-22 ENCOUNTER — Ambulatory Visit (HOSPITAL_COMMUNITY)
Admission: RE | Admit: 2017-09-22 | Discharge: 2017-09-22 | Disposition: A | Discharge status: Home / Self Care | Payer: Self-pay | Source: Ambulatory Visit | Attending: Family Medicine | Admitting: Family Medicine

## 2017-09-22 ENCOUNTER — Other Ambulatory Visit (HOSPITAL_COMMUNITY): Payer: Self-pay | Admitting: Maternal & Fetal Medicine

## 2017-09-22 ENCOUNTER — Encounter (HOSPITAL_COMMUNITY): Payer: Self-pay

## 2017-09-22 DIAGNOSIS — O24415 Gestational diabetes mellitus in pregnancy, controlled by oral hypoglycemic drugs: Secondary | ICD-10-CM

## 2017-09-22 DIAGNOSIS — O09522 Supervision of elderly multigravida, second trimester: Secondary | ICD-10-CM | POA: Insufficient documentation

## 2017-09-22 DIAGNOSIS — O34219 Maternal care for unspecified type scar from previous cesarean delivery: Secondary | ICD-10-CM | POA: Insufficient documentation

## 2017-09-22 DIAGNOSIS — O321XX0 Maternal care for breech presentation, not applicable or unspecified: Secondary | ICD-10-CM | POA: Insufficient documentation

## 2017-09-22 DIAGNOSIS — Z3A18 18 weeks gestation of pregnancy: Secondary | ICD-10-CM

## 2017-09-22 DIAGNOSIS — O09521 Supervision of elderly multigravida, first trimester: Secondary | ICD-10-CM

## 2017-09-22 DIAGNOSIS — O285 Abnormal chromosomal and genetic finding on antenatal screening of mother: Secondary | ICD-10-CM | POA: Insufficient documentation

## 2017-09-22 DIAGNOSIS — O0990 Supervision of high risk pregnancy, unspecified, unspecified trimester: Secondary | ICD-10-CM

## 2017-09-22 DIAGNOSIS — Z3689 Encounter for other specified antenatal screening: Secondary | ICD-10-CM | POA: Insufficient documentation

## 2017-09-22 NOTE — Progress Notes (Signed)
Victoria Russo present for interpreting.

## 2017-09-24 ENCOUNTER — Ambulatory Visit (HOSPITAL_COMMUNITY): Payer: Self-pay

## 2017-09-24 ENCOUNTER — Other Ambulatory Visit (HOSPITAL_COMMUNITY): Payer: Self-pay | Admitting: *Deleted

## 2017-09-24 DIAGNOSIS — Z3689 Encounter for other specified antenatal screening: Secondary | ICD-10-CM

## 2017-10-07 ENCOUNTER — Ambulatory Visit (HOSPITAL_COMMUNITY)
Admission: RE | Admit: 2017-10-07 | Discharge: 2017-10-07 | Disposition: A | Discharge status: Home / Self Care | Payer: Self-pay | Source: Ambulatory Visit | Attending: Family Medicine | Admitting: Family Medicine

## 2017-10-07 ENCOUNTER — Encounter: Payer: Self-pay | Admitting: Obstetrics and Gynecology

## 2017-10-07 ENCOUNTER — Ambulatory Visit (INDEPENDENT_AMBULATORY_CARE_PROVIDER_SITE_OTHER): Payer: Self-pay | Admitting: Obstetrics and Gynecology

## 2017-10-07 VITALS — BP 113/55 | HR 77 | Wt 171.0 lb

## 2017-10-07 DIAGNOSIS — E669 Obesity, unspecified: Secondary | ICD-10-CM

## 2017-10-07 DIAGNOSIS — O3510X0 Maternal care for (suspected) chromosomal abnormality in fetus, unspecified, not applicable or unspecified: Secondary | ICD-10-CM

## 2017-10-07 DIAGNOSIS — O99210 Obesity complicating pregnancy, unspecified trimester: Secondary | ICD-10-CM

## 2017-10-07 DIAGNOSIS — O24415 Gestational diabetes mellitus in pregnancy, controlled by oral hypoglycemic drugs: Secondary | ICD-10-CM

## 2017-10-07 DIAGNOSIS — O351XX0 Maternal care for (suspected) chromosomal abnormality in fetus, not applicable or unspecified: Secondary | ICD-10-CM

## 2017-10-07 DIAGNOSIS — O09522 Supervision of elderly multigravida, second trimester: Secondary | ICD-10-CM

## 2017-10-07 DIAGNOSIS — Z603 Acculturation difficulty: Secondary | ICD-10-CM

## 2017-10-07 DIAGNOSIS — Z3A20 20 weeks gestation of pregnancy: Secondary | ICD-10-CM | POA: Insufficient documentation

## 2017-10-07 DIAGNOSIS — Z789 Other specified health status: Secondary | ICD-10-CM

## 2017-10-07 DIAGNOSIS — O34219 Maternal care for unspecified type scar from previous cesarean delivery: Secondary | ICD-10-CM

## 2017-10-07 DIAGNOSIS — O285 Abnormal chromosomal and genetic finding on antenatal screening of mother: Secondary | ICD-10-CM

## 2017-10-07 DIAGNOSIS — O0990 Supervision of high risk pregnancy, unspecified, unspecified trimester: Secondary | ICD-10-CM

## 2017-10-07 LAB — POCT URINALYSIS DIP (DEVICE)
Bilirubin Urine: NEGATIVE
GLUCOSE, UA: NEGATIVE mg/dL
HGB URINE DIPSTICK: NEGATIVE
KETONES UR: NEGATIVE mg/dL
NITRITE: NEGATIVE
PROTEIN: NEGATIVE mg/dL
Specific Gravity, Urine: >=1.03 (ref 1.005–1.030)
Urobilinogen, UA: 0.2 mg/dL (ref 0.0–1.0)
pH: 5 (ref 5.0–8.0)

## 2017-10-07 NOTE — Progress Notes (Addendum)
Prenatal Visit Note Date: 10/07/2017 Clinic: [facility]  Subjective:  Victoria Russo is a 41 y.o. G4P1003 at 7739w4d being seen today for ongoing prenatal care.  She is currently monitored for the following issues for this high-risk pregnancy and has GDM, class A2; Supervision of high risk pregnancy, antepartum; AMA (advanced maternal age) multigravida 35+; History of cesarean delivery, antepartum; Abnormal genetic test-high risk of Trisomy 21 on NIPT; Obesity (BMI 30-39.9); Obesity in pregnancy; Language barrier; Suspected chromosome anomaly of fetus affecting management of mother in singleton pregnancy, antepartum; and [redacted] weeks gestation of pregnancy on their problem list.  Patient reports: anxiety re: fetal diagnosis, is wondering about termination.   Contractions: Not present. Vag. Bleeding: None.  Movement: Present. Denies leaking of fluid.   The following portions of the patient's history were reviewed and updated as appropriate: allergies, current medications, past family history, past medical history, past social history, past surgical history and problem list. Problem list updated.  Objective:   Vitals:   10/07/17 1259  BP: (!) 113/55  Pulse: 77  Weight: 171 lb (77.6 kg)    Fetal Status: Fetal Heart Rate (bpm): 158   Movement: Present     General:  Alert, oriented and cooperative. Patient is in no acute distress.  Skin: Skin is warm and dry. No rash noted.   Cardiovascular: Normal heart rate noted  Respiratory: Normal respiratory effort, no problems with respiration noted  Abdomen: Soft, gravid, appropriate for gestational age. Pain/Pressure: Absent     Pelvic:  Cervical exam deferred        Extremities: Normal range of motion.  Edema: None  Mental Status: Normal mood and affect. Normal behavior. Normal judgment and thought content.   Urinalysis:      Assessment and Plan:  Pregnancy: G4P1003 at 5139w4d  1. Abnormal genetic test-high risk of  Trisomy 21 on NIPT D/w her and partner at length. [facility] and declined amino and fetal echo. I told her that an amino is 100% definitive and can look for other genetic issues but her cffdna is >95% predictive that she likely does indeed have a fetus with DS and that if she doesn't do the amnio that she will get a definitive dx after delivery. She is wondering about termination. I told her that in [place], she is too late to get it performed. I told her that DS is a spectrum, with some severely affected and some able to live by themselves and take care of themselves as adults. I encouraged her to get a fetal echo in order to have more reassure that the heart is fine and ensure that [facility]'s [facility]; her anatomy heart views were normal and missing NB and short long bones but all else appeared normal, which I told her is encouraging; pt to f/u with mfm today re: if she wants to amnio. I encouraged her that a DS support group would help her and she was amenable to this. I'll try and find some resources and let her know.   2. Gestational diabetes mellitus (GDM) in second trimester controlled on oral hypoglycemic drug Normal BS log with glyburide 2.5 with dinner. Told pt she can stop achs checks except for the am fasting. Baseline PC ratio ordered.  - Protein / creatinine ratio, urine  3. Elderly multigravida in second trimester See above  4. History of cesarean delivery, antepartum D/w her later in pregnancy re: rpt and possible btl  5. Supervision of high risk pregnancy, antepartum Routine care  6. Obesity (BMI 30-39.9)  7. Obesity in pregnancy  interpreter used.   Preterm labor symptoms and general obstetric precautions including but not limited to vaginal bleeding, contractions, leaking of fluid and fetal movement were reviewed in detail with the patient. Please refer to After Visit Summary for other counseling recommendations.  Return in about 2  weeks (around 10/21/2017).   Weston BingPickens, Shaine Newmark, MD

## 2017-10-07 NOTE — Progress Notes (Signed)
Genetic Counseling  High-Risk Gestation Note  Appointment Date:  10/07/2017 Referred By: Reva BoresPratt, Tanya S, MD Date of Birth:  [birth year]   Pregnancy History: G4P1003 Estimated Date of Delivery: 02/20/18 Estimated Gestational Age: 478w4d Attending: Particia NearingMartha Decker, MD   Ms. Winnifred FriarVeronica Cartagena-Herrera and her partner were seen for follow-up genetic counseling given the high risk Trisomy 21 risk from noninvasive prenatal screening and to review follow-up testing and pregnancy options.   Spanish/English interpreter, Cicero DuckErika, provided interpretation for today's visit.   In summary:  Reviewed positive NIPS result (high risk Down syndrome) and associated PPV of ~93%  Anatomy ultrasound performed 09/23/15 visualized short long bones and absent nasal bone  Discussed these findings as soft markers for Down syndrome  Given these findings the PPV from NIPS result is likely now higher than 93%  Spent time reviewing the variable features of Down syndrome and available supportive resources and services for individuals with Down syndrome  They understand that karyotype analysis can diagnose or rule out Down syndrome but cannot predict specific features associated in that particular individual  Discussed screening options  Follow-up ultrasound  Fetal echocardiogram  Discussed diagnostic testing options  Amniocentesis - planned 12/07  Couple stated that after much deliberation they plan to pursue termination of pregnancy if Down syndrome is confirmed  They understand that they are past the legal limit in [place] but have options available out of state   We briefly reviewed the methodology of NIPS and the associated positive predictive value of approximately 93% of Ms. Cartagena-Herrera's result. See previous genetic counseling note for more detailed discussion.   Ms. [name] had detailed ultrasound in our office on 09/22/17. Visualized fetal anatomy was within normal limits. Absent nasal bone  and short long bones were visualized at that time. Complete results reported separately. We discussed that the second trimester genetic sonogram is targeted at identifying features associated with aneuploidy.  It has evolved as a screening tool used to provide an individualized risk assessment for Down syndrome and other trisomies.  The ability of sonography to aid in the detection of aneuploidies relies on identification of both major structural anomalies and "soft markers."  The patient was counseled that the latter term refers to findings that are often normal variants and do not cause any significant medical problems.  Nonetheless, these markers have a known association with aneuploidy.  An absent or hypoplastic (undeveloped, or slightly smaller than expected) nasal bone is commonly a normal variation with no associated problems.  This may be a family trait and can be more common in some ethnic groups, such as the African American population.  However, an absent or hypoplastic nasal bone has been shown to increase the chance for Down syndrome in a pregnancy. We reviewed that the humerus and femur are typically referred to as the "long bones." A shortened measurement of the longs bones is typically defined as measuring less than the 5%tile for gestational age. This occurs in an estimated 6-7% of pregnancies and typically represent a variation of normal. However, short long bones on prenatal ultrasound have been described to be associated with fetal aneuploidy, underlying genetic conditions, and fetal growth restriction. Regarding an association with fetal aneuploidy, shortened femur length on prenatal ultrasound has been reported to have a weaker association with aneuploidy compared to shortened humeri. We reviewed that while ultrasound is not diagnostic for chromosome conditions, the presence of these soft markers in combination with the NIPS result would likely indicate a PPV of higher than 93% for Down  syndrome in the pregnancy.   We reviewed that the cell free fetal DNA test can not distinguish between aneuploidy confined to the placenta, trisomy 21, translocation 21, or mosaic trisomy 21. We discussed the availability of amniocentesis or peripheral blood chromosome analysis for confirmation of the diagnosis.  We spent time reviewing the high degree of variability among individuals with Down syndrome. Ms. [name] had concerns regarding possible associated medical features, and she understands that there are set medical guidelines for individuals with Down syndrome. However, there are many features that cannot be predicted or ruled out in advanced as certain features may present with time.    This couple was counseled that Down syndrome occurs once per every 800 births and is associated with specific features. However, there is a high degree of variability seen among children who have this condition, meaning that every child with Down syndrome will not be affected in exactly the same way, and some children will have more or less features than others. They were counseled that half of individuals with Down syndrome have a cardiac anomaly and ~10-15% have an intestinal difference. Other anomalies of various organ systems have also been described in association with this diagnosis. Although the major anatomy appears structurally normal by ultrasound, a fetal echocardiogram is recommended for a detailed evaluation of the fetal heart. Approximately 70-80% of children with Down syndrome have hypotonia which may lead to delays in sitting, walking, and talking. Hypotonia does tend to improve with age and early intervention services such as physical, occupational, and speech therapies can help with achievement of developmental milestones. We discussed that these therapies are typically provided to children (with qualifying diagnoses) from birth until age 313. We also discussed that Down syndrome is associated  with characteristic facial features. Because of these facial features, many children with Down syndrome look similar to each other, but they were reminded that each child with Down syndrome is unique and will have many more features in common with his or her own family members.   We also discussed that providing children with Down syndrome with a stimulating physical and social environment, as well as ensuring that the child receives adequate medical care and appropriate therapies, will help these children to reach their full potential. With the advances in medical technology, early intervention, and supportive therapies, many individuals with Down syndrome are able to live with an increasing degree of independence. Today, many adults with Down syndrome care for themselves, have jobs, and often times live in group homes or apartments where assistance is available if needed.   The couple stated that they have had much reading and deliberation and now feel that they would most likely terminate the pregnancy in the case that Down syndrome is confirmed. Ms. [name] stated that adoption would not be a feasible option for them. We discussed that the limit for termination of pregnancy in [place] is [redacted] weeks gestation, and the couple understands this. We discussed that there are states with later gestational age limits. Ms. [name] stated that she would like to pursue amniocentesis for karyotype analysis. Amniocentesis is scheduled for tomorrow, 10/08/17.   I counseled this couple regarding the above risks and available options.  The approximate face-to-face time with the genetic counselor was 45 minutes.  Quinn PlowmanKaren Izaiah Tabb, MS,  Certified Genetic Counselor 10/07/2017

## 2017-10-08 ENCOUNTER — Ambulatory Visit (HOSPITAL_COMMUNITY)
Admission: RE | Admit: 2017-10-08 | Discharge: 2017-10-08 | Disposition: A | Discharge status: Home / Self Care | Payer: Self-pay | Source: Ambulatory Visit | Attending: Family Medicine | Admitting: Family Medicine

## 2017-10-08 ENCOUNTER — Encounter (HOSPITAL_COMMUNITY): Payer: Self-pay

## 2017-10-08 DIAGNOSIS — O34219 Maternal care for unspecified type scar from previous cesarean delivery: Secondary | ICD-10-CM | POA: Insufficient documentation

## 2017-10-08 DIAGNOSIS — O285 Abnormal chromosomal and genetic finding on antenatal screening of mother: Secondary | ICD-10-CM | POA: Insufficient documentation

## 2017-10-08 DIAGNOSIS — Z3A20 20 weeks gestation of pregnancy: Secondary | ICD-10-CM | POA: Insufficient documentation

## 2017-10-08 DIAGNOSIS — O24415 Gestational diabetes mellitus in pregnancy, controlled by oral hypoglycemic drugs: Secondary | ICD-10-CM | POA: Insufficient documentation

## 2017-10-08 DIAGNOSIS — O359XX0 Maternal care for (suspected) fetal abnormality and damage, unspecified, not applicable or unspecified: Secondary | ICD-10-CM

## 2017-10-08 DIAGNOSIS — O09522 Supervision of elderly multigravida, second trimester: Secondary | ICD-10-CM | POA: Insufficient documentation

## 2017-10-08 LAB — PROTEIN / CREATININE RATIO, URINE
CREATININE, UR: 110.6 mg/dL
PROTEIN UR: 12.7 mg/dL
Protein/Creat Ratio: 115 mg/g creat (ref 0–200)

## 2017-10-08 LAB — ROUTINE CHROMOSOME - KARYOTYPE

## 2017-10-18 ENCOUNTER — Encounter (HOSPITAL_COMMUNITY): Payer: Self-pay

## 2017-10-20 ENCOUNTER — Other Ambulatory Visit (HOSPITAL_COMMUNITY): Payer: Self-pay

## 2017-10-20 ENCOUNTER — Other Ambulatory Visit (HOSPITAL_COMMUNITY): Payer: Self-pay | Admitting: Maternal and Fetal Medicine

## 2017-10-20 ENCOUNTER — Ambulatory Visit (HOSPITAL_COMMUNITY)
Admission: RE | Admit: 2017-10-20 | Discharge: 2017-10-20 | Disposition: A | Discharge status: Home / Self Care | Payer: Self-pay | Source: Ambulatory Visit | Attending: Family Medicine | Admitting: Family Medicine

## 2017-10-20 ENCOUNTER — Encounter (HOSPITAL_COMMUNITY): Payer: Self-pay

## 2017-10-20 DIAGNOSIS — O285 Abnormal chromosomal and genetic finding on antenatal screening of mother: Secondary | ICD-10-CM

## 2017-10-20 DIAGNOSIS — Z362 Encounter for other antenatal screening follow-up: Secondary | ICD-10-CM

## 2017-10-20 DIAGNOSIS — Z3689 Encounter for other specified antenatal screening: Secondary | ICD-10-CM

## 2017-10-20 DIAGNOSIS — O24415 Gestational diabetes mellitus in pregnancy, controlled by oral hypoglycemic drugs: Secondary | ICD-10-CM | POA: Insufficient documentation

## 2017-10-20 DIAGNOSIS — Z3A22 22 weeks gestation of pregnancy: Secondary | ICD-10-CM

## 2017-10-20 DIAGNOSIS — Q740 Other congenital malformations of upper limb(s), including shoulder girdle: Secondary | ICD-10-CM | POA: Insufficient documentation

## 2017-10-20 DIAGNOSIS — O09522 Supervision of elderly multigravida, second trimester: Secondary | ICD-10-CM | POA: Insufficient documentation

## 2017-10-20 DIAGNOSIS — Q909 Down syndrome, unspecified: Secondary | ICD-10-CM | POA: Insufficient documentation

## 2017-10-21 ENCOUNTER — Telehealth (HOSPITAL_COMMUNITY): Payer: Self-pay | Admitting: MS"

## 2017-10-21 NOTE — Telephone Encounter (Signed)
Called Ms. Winnifred FriarVeronica Cartagena-Herrera to follow-up from her 10/20/17 visit to discuss pregnancy options. Telephonic Spanish/English Medical Interpreters [phone] [phone]) from [facility] [employer] provided interpretation. Ms. [name] stated that as she and the father of the pregnancy had previously discussed, if amniocentesis confirmed Down syndrome, they would like to interrupt the pregnancy. We reviewed the Trisomy 21 results from amniocentesis and that these are considered diagnostic. Ms. [name] stated that they would like to proceed with terminatino of pregnancy. We reviewed that she is past the legal limit for termination in [place] but there are providers near the [place] area that can perform termination of pregnancy at later gestation. Ms. [name] was provided the contact number for Dr. Ferd GlassingLeRoy Carhart[place] in [place], [place] [phone]), and we discussed that the appointment line as well as the clinic have Spanish interpreters readily available. We discussed that the next appointment likely wouldn't be until January 2, given the holidays. We also discussed that they can discuss payment and financial resources options with her. The patient plans to contact their office directly to schedule an appointment with them, and we will send medical records from the pregnancy to their office. She was encouraged to contact us with additional questions or concerns or if we can provide additional support to the couple.   Clydie BraunKaren Axel Meas  10/21/2017 11:49 AM

## 2017-10-21 NOTE — Telephone Encounter (Signed)
Patient called with follow-up question. She spoke with an individual at Dr. Forest Beckerarhart's office that stated she would need doppler ultrasound to see if they could schedule an appointment for her. I called Victoria Russo back to discuss that one of our physicians called Dr. Forest Beckerarhart's office and clarified that the reason they stated a doppler ultrasound is required is because of the anterior placenta and history of previous c sections. Per our physician, previous ultrasounds here were able to see the position of the placenta, and a previous ultrasound report can be ammended to more clearly state the position of the placenta. We will plan to fax the ammended ultrasound report to Dr. Forest Beckerarhart's office. Victoria Russo stated that she does not have an email address and that the person was going to email their location to her. She asked if I could give her information on the [place] clinic for Dr. Nonah Mattesarhart. I planned to print information from their website in Spanish and mail to the patient. She was encouraged to call with additional questions.   Victoria Russo  10/21/2017 4:53 PM

## 2017-10-25 ENCOUNTER — Other Ambulatory Visit (HOSPITAL_COMMUNITY): Payer: Self-pay

## 2017-10-28 ENCOUNTER — Ambulatory Visit (INDEPENDENT_AMBULATORY_CARE_PROVIDER_SITE_OTHER): Payer: Self-pay | Admitting: Obstetrics and Gynecology

## 2017-10-28 VITALS — BP 116/56 | HR 80 | Wt 174.9 lb

## 2017-10-28 DIAGNOSIS — E669 Obesity, unspecified: Secondary | ICD-10-CM

## 2017-10-28 DIAGNOSIS — O0992 Supervision of high risk pregnancy, unspecified, second trimester: Secondary | ICD-10-CM

## 2017-10-28 DIAGNOSIS — O99210 Obesity complicating pregnancy, unspecified trimester: Secondary | ICD-10-CM

## 2017-10-28 DIAGNOSIS — O24419 Gestational diabetes mellitus in pregnancy, unspecified control: Secondary | ICD-10-CM

## 2017-10-28 DIAGNOSIS — O0990 Supervision of high risk pregnancy, unspecified, unspecified trimester: Secondary | ICD-10-CM

## 2017-10-28 DIAGNOSIS — O351XX0 Maternal care for (suspected) chromosomal abnormality in fetus, not applicable or unspecified: Secondary | ICD-10-CM

## 2017-10-28 DIAGNOSIS — O3513X0 Maternal care for (suspected) chromosomal abnormality in fetus, trisomy 21, not applicable or unspecified: Secondary | ICD-10-CM

## 2017-10-28 DIAGNOSIS — O34219 Maternal care for unspecified type scar from previous cesarean delivery: Secondary | ICD-10-CM

## 2017-10-28 DIAGNOSIS — O09522 Supervision of elderly multigravida, second trimester: Secondary | ICD-10-CM

## 2017-10-28 DIAGNOSIS — Z789 Other specified health status: Secondary | ICD-10-CM

## 2017-10-28 LAB — POCT URINALYSIS DIP (DEVICE)
BILIRUBIN URINE: NEGATIVE
Glucose, UA: 500 mg/dL — AB
LEUKOCYTES UA: NEGATIVE
NITRITE: NEGATIVE
PH: 6.5 (ref 5.0–8.0)
Protein, ur: NEGATIVE mg/dL
Urobilinogen, UA: 0.2 mg/dL (ref 0.0–1.0)

## 2017-10-28 NOTE — Progress Notes (Signed)
Prenatal Visit Note Date: 10/28/2017 Clinic: [facility]  Subjective:  Victoria Russo is a 41 y.o. G4P1003 at 3639w4d being seen today for ongoing prenatal care.  She is currently monitored for the following issues for this high-risk pregnancy and has GDM, class A2; Supervision of high risk pregnancy, antepartum; AMA (advanced maternal age) multigravida 35+; History of cesarean delivery, antepartum; Abnormal genetic test-high risk of Trisomy 21 on NIPT; Obesity (BMI 30-39.9); Obesity in pregnancy; and Language barrier on their problem list.  Patient reports no complaints.   Contractions: Not present. Vag. Bleeding: None.  Movement: Present. Denies leaking of fluid.   The following portions of the patient's history were reviewed and updated as appropriate: allergies, current medications, past family history, past medical history, past social history, past surgical history and problem list. Problem list updated.  Objective:   Vitals:   10/28/17 1535  BP: (!) 116/56  Pulse: 80  Weight: 174 lb 14.4 oz (79.3 kg)    Fetal Status: Fetal Heart Rate (bpm): 159   Movement: Present     General:  Alert, oriented and cooperative. Patient is in no acute distress.  Skin: Skin is warm and dry. No rash noted.   Cardiovascular: Normal heart rate noted  Respiratory: Normal respiratory effort, no problems with respiration noted  Abdomen: Soft, gravid, appropriate for gestational age. Pain/Pressure: Absent     Pelvic:  Cervical exam deferred        Extremities: Normal range of motion.  Edema: None  Mental Status: Normal mood and affect. Normal behavior. Normal judgment and thought content.   Urinalysis:      Assessment and Plan:  Pregnancy: G4P1003 at 6639w4d  1. GDM, class A2 Didn't bring log but states qid checks are normal. Continue with glyb 2.5 with dinner. Needs repeat growth for mid jan if pt continues the pregnancy. See below  2. Supervision of high risk  pregnancy, antepartum  3. Elderly multigravida in second trimester  4. History of cesarean delivery, antepartum  5. Abnormal genetic test-high risk of Trisomy 21 on NIPT Confirmed on amino. Pt states she is to have termination  Next week. Declines DS support groups, resources.  6. Obesity (BMI 30-39.9)  7. Obesity in pregnancy  8. Language barrier Interpreter used  Preterm labor symptoms and general obstetric precautions including but not limited to vaginal bleeding, contractions, leaking of fluid and fetal movement were reviewed in detail with the patient. Please refer to After Visit Summary for other counseling recommendations.  Return in about 2 weeks (around 11/11/2017) for rob. low risk .   Shelby BingPickens, Macil Crady, MD

## 2017-11-15 ENCOUNTER — Telehealth (HOSPITAL_COMMUNITY): Payer: Self-pay | Admitting: MS"

## 2017-11-15 NOTE — Telephone Encounter (Signed)
Called patient with [employer] Spanish/English medical interpreter [phone] to follow-up. Patient confirmed that she terminated the pregnancy out of state, with Dr. Forest Beckerarhart's office, earlier this month. She stated that she is back home and doing "so-so" working on coping with the grief of the loss of her son. I discussed that I will look for resources regarding grief and coping after pregnancy loss in Spanish that I could forward to her. Also encouraged her to contact her primary OB provider to inquire about whether they would need to see her for post-partum check. All questions answered at this time. She was encouraged to call back if she has additional questions or concerns.   Clydie BraunKaren Miking Usrey  11/15/2017 12:23 PM

## 2017-11-17 ENCOUNTER — Ambulatory Visit (INDEPENDENT_AMBULATORY_CARE_PROVIDER_SITE_OTHER): Payer: Self-pay | Admitting: Obstetrics & Gynecology

## 2017-11-17 ENCOUNTER — Encounter: Payer: Self-pay | Admitting: Obstetrics & Gynecology

## 2017-11-17 VITALS — BP 110/69 | HR 78 | Ht 61.0 in | Wt 166.4 lb

## 2017-11-17 DIAGNOSIS — Z8742 Personal history of other diseases of the female genital tract: Secondary | ICD-10-CM

## 2017-11-17 DIAGNOSIS — Z3009 Encounter for other general counseling and advice on contraception: Secondary | ICD-10-CM

## 2017-11-17 NOTE — Progress Notes (Signed)
Subjective:     Victoria Russo is a 42 y.o. female who presents for a postpartum visit. She is 2 weeks postpartum following a late term [place]. I have fully reviewed the prenatal and intrapartum course. The delivery was at 25 gestational weeks. Outcome: theraputic abortion. Anesthesia: no records. Postpartum course has been normal. Bowel function is normal. Bladder function is normal. Patient is not sexually active. Contraception method is asks about BTL but will get DMPA.  The following portions of the patient's history were reviewed and updated as appropriate: allergies, current medications, past family history, past medical history, past social history, past surgical history and problem list.  Review of Systems Pertinent items are noted in HPI.   Objective:    BP 110/69   Pulse 78   Ht 5\' 1"  (1.549 m)   Wt 166 lb 6.4 oz (75.5 kg)   LMP 05/16/2017   BMI 31.44 kg/m   General:  alert, cooperative and no distress           Abdomen: soft, non-tender; bowel sounds normal; no masses,  no organomegaly   Vulva:  not evaluated  Vagina: not evaluated                    Assessment:     2 week post termination. Pap smear not done at today's visit.   Plan:    1. Contraception: Depo-Provera injections 2. Will go to HD  3. Follow up as needed.    Adam PhenixArnold, James G, MD 11/17/2017

## 2017-11-17 NOTE — Patient Instructions (Signed)
Elección del método anticonceptivo  (Contraception Choices)  La anticoncepción (control de la natalidad) es el uso de cualquier método o dispositivo para evitar el embarazo. A continuación se indican algunos de esos métodos.  ANTICONCEPTIVOS HORMONALES  · Un pequeño tubo colocado bajo la piel de la parte superior del brazo (implante). El tubo puede permanecer en el lugar durante 3 años. El implante debe quitarse después de 3 años.  · Inyecciones que se aplican cada 3 meses.  · Píldoras que deben tomarse todos los días.  · Parches que se cambian una vez por semana.  · Un anillo que se coloca en la vagina (anillo vaginal). El anillo se deja en su lugar durante 3 semanas y se retira durante 1 semana. Luego se coloca un nuevo anillo en la vagina.  · Píldoras para el control de la natalidad después de tener sexo (relaciones sexuales) sin protección.    ANTICONCEPTIVOS DE BARRERA  · Una cubierta delgada que se usa sobe el pene (condón masculino) que se coloca durante las relaciones sexuales.  · Una cubierta blanda y suelta que se coloca en la vagina (condón femenino) antes de las relaciones sexuales.  · Un dispositivo de goma que se aplica sobre el cuello del útero (diafragma). Este dispositivo debe ser hecho para usted. Se coloca en la vagina antes de tener relaciones sexuales. Debe dejarlo colocado en la vagina durante 6 a 8 horas después de las relaciones sexuales.  · Un capuchón pequeño y suave que se fija sobre el cuello del útero (capuchón cervical). Este capuchón debe ser hecho para usted. Debe dejarlo colocado en la vagina durante 48 horas después de las relaciones sexuales.  · Una esponja que se coloca en la vagina antes de tener relaciones sexuales.  · Una sustancia química que destruye o impide que los espermatozoides ingresen al cuello y al útero (espermicida). La sustancia química puede ser en crema, gel, espuma o píldoras.    DISPOSITIVO DE CONTROL INTRAUTERINO (DIU)   · El DIU es un pequeño dispositivo plástico en forma de T. Se coloca dentro del útero. Hay dos tipos de DIU:  ? DIU de cobre. El dispositivo está recubierto en alambre de cobre. El cobre produce un líquido que destruye los espermatozoides. Puede permanecer colocado durante 10 años.  ? DIU hormonal. La hormona impide que ocurra el embarazo. Puede permanecer colocado durante 5 años.    MÉTODOS PERMANENTES  · La mujer puede hacerse sellar, ligar u obstruir las trompas de Falopio durante una cirugía. Esto impide que el óvulo llegue hasta el útero.  · El médico coloca un alambre diminuto o lo inserta en cada una de las trompas de Falopio. Esto produce un tejido cicatrizal que obstruye las trompas de Falopio.  · En el hombre pueden ligarse los conductos por los que pasan los espermatozoides (vasectomía).    CONTROL DE LA NATALIDAD POR PLANIFICACIÓN FAMILIAR NATURAL  · La planificación familiar natural significa no tener relaciones sexuales o usar un método anticonceptivo de barrera en los períodos fértiles de la mujer.  · Use a un almanaque para llevar un registro de la extensión de cada período y para conocer los días en los que puede quedar embarazada.  · Evite tener relaciones sexuales durante la ovulación.  · Use un termómetro para medir la temperatura corporal. También reconozca los síntomas de la ovulación.  · El momento de tener relaciones sexuales debe ser después de que la mujer haya ovulado.  Use condones para protegerse de las enfermedades de transmisión   sexual (ETS). Hágalo independientemente del tipo de anticonceptivo que use. Hable con su médico acerca de cuál es el mejor método anticonceptivo para usted.  Esta información no tiene como fin reemplazar el consejo del médico. Asegúrese de hacerle al médico cualquier pregunta que tenga.  Document Released: 02/03/2011 Document Revised: 10/24/2013 Document Reviewed: 05/10/2013  Elsevier Interactive Patient Education © 2017 Elsevier Inc.

## 2017-11-24 ENCOUNTER — Encounter: Payer: Self-pay | Admitting: General Practice

## 2017-11-24 ENCOUNTER — Telehealth: Payer: Self-pay | Admitting: General Practice

## 2017-11-24 NOTE — Telephone Encounter (Signed)
Patient called and left message on nurse line in spanish stating she has question about a medication. Blanca interpreted message.   [facility]ed [facility] with [facility] for interpreter. Patient states she isn't sure if she should continue taking the glyburide or not. Per Dr Earlene Plateravis, patient should discontinue glyburide and come in for 2 hr pp. Told patient to stop taking the glyburide and asked if she can come in 2/18 @ 830 for 2 hr. Patient verbalized understanding & states she can come then. Patient is aware to come fasting. Patient asked if she should continue taking the baby aspirin or PNV. Told patient she can stop those as well. Patient verbalized understanding & had no questions

## 2017-12-01 ENCOUNTER — Encounter: Payer: Self-pay | Admitting: Obstetrics and Gynecology

## 2017-12-15 ENCOUNTER — Encounter: Payer: Self-pay | Admitting: Obstetrics and Gynecology

## 2017-12-20 ENCOUNTER — Other Ambulatory Visit: Payer: Self-pay

## 2017-12-28 ENCOUNTER — Other Ambulatory Visit: Payer: Self-pay

## 2017-12-28 ENCOUNTER — Other Ambulatory Visit: Payer: Self-pay | Admitting: General Practice

## 2017-12-28 DIAGNOSIS — O24439 Gestational diabetes mellitus in the puerperium, unspecified control: Secondary | ICD-10-CM

## 2017-12-29 ENCOUNTER — Encounter: Payer: Self-pay | Admitting: Obstetrics and Gynecology

## 2018-02-19 ENCOUNTER — Emergency Department
Admission: EM | Admit: 2018-02-19 | Discharge: 2018-02-19 | Disposition: A | Discharge status: Home / Self Care | Payer: Self-pay | Source: Home / Self Care | Attending: Family Medicine | Admitting: Family Medicine

## 2018-02-19 ENCOUNTER — Other Ambulatory Visit: Payer: Self-pay

## 2018-02-19 ENCOUNTER — Encounter: Payer: Self-pay | Admitting: Emergency Medicine

## 2018-02-19 DIAGNOSIS — J9801 Acute bronchospasm: Secondary | ICD-10-CM

## 2018-02-19 DIAGNOSIS — J029 Acute pharyngitis, unspecified: Secondary | ICD-10-CM

## 2018-02-19 HISTORY — DX: Bronchitis, not specified as acute or chronic: J40

## 2018-02-19 MED ORDER — ALBUTEROL SULFATE HFA 108 (90 BASE) MCG/ACT IN AERS: 2.0000 | INHALATION_SPRAY | RESPIRATORY_TRACT | 2 refills | Status: DC | PRN

## 2018-02-19 MED ORDER — PREDNISONE 20 MG PO TABS: ORAL_TABLET | ORAL | 0 refills | Status: DC

## 2018-02-19 MED ORDER — AZITHROMYCIN 250 MG PO TABS: ORAL_TABLET | ORAL | 0 refills | Status: DC

## 2018-02-19 NOTE — ED Provider Notes (Signed)
[facility]    CSN: [id number] Arrival date & time: 02/19/18  1201     History   Chief Complaint Chief Complaint  Patient presents with  . Shortness of Breath    HPI Victoria Russo is a 42 y.o. female.   Patient is Spanish speaking and husband acts as Engineer, technical sales. She complains of a persistent sore throat for about a week, without cough or nasal congestion.  She admits that she has had difficulty swallowing for about a year, and points to her anterior neck.  No recent fevers, chills, and sweats.  She complains of intermittent wheezing and shortness of breath with activity.  She does not believe that she has asthma, but has been prescribed an albuterol inhaler in the past that has been helpful. She had a tubal ligation about 2 weeks ago with uneventful recovery.   The history is provided by the patient and the spouse. The history is limited by a language barrier. No language interpreter was used.    Past Medical History:  Diagnosis Date  . Bronchitis   . Gestational diabetes   . UTI (urinary tract infection)     Patient Active Problem List   Diagnosis Date Noted  . Obesity (BMI 30-39.9) 10/07/2017  . Obesity in pregnancy 10/07/2017  . Language barrier 10/07/2017    Past Surgical History:  Procedure Laterality Date  . CESAREAN SECTION    . TUBAL LIGATION      OB History    Gravida  4   Para  3   Term  1   Preterm      AB      Living  3     SAB      TAB      Ectopic      Multiple      Live Births  3            Home Medications    Prior to Admission medications   Medication Sig Start Date End Date Taking? Authorizing Provider  ibuprofen (ADVIL,MOTRIN) 200 MG tablet Take 200 mg by mouth every 8 (eight) hours as needed.   Yes [provider]  Prenatal Multivit-Min-Fe-FA (PRENATAL VITAMINS PO) Take by mouth.   Yes [provider]  albuterol (PROVENTIL HFA;VENTOLIN HFA) 108 (90 Base) MCG/ACT inhaler  Inhale 2 puffs into the lungs every 4 (four) hours as needed for wheezing or shortness of breath. 02/19/18   Lattie Haw, MD  azithromycin (ZITHROMAX Z-PAK) 250 MG tablet Take 2 tabs today; then begin one tab once daily for 4 more days. 02/19/18   Lattie Haw, MD  predniSONE (DELTASONE) 20 MG tablet Take one tab by mouth twice daily for 4 days, then one daily for 3 days. Take with food. 02/19/18   Lattie Haw, MD  Prenatal Multivit-Min-Fe-FA (PRENATAL VITAMINS) 0.8 MG tablet Take 1 tablet by mouth daily. 08/11/17   Reva Bores, MD    Family History Family History  Problem Relation Age of Onset  . Cancer Mother 34       endometrial  . Diabetes Maternal Aunt     Social History Social History   Tobacco Use  . Smoking status: Never Smoker  . Smokeless tobacco: Never Used  Substance Use Topics  . Alcohol use: Yes  . Drug use: No     Allergies   Patient has no known allergies.   Review of Systems Review of Systems + sore throat No cough No pleuritic pain +  wheezing No nasal congestion No post-nasal drainage No sinus pain/pressure No itchy/red eyes No earache No hemoptysis + SOB with activity No fever, + chills No nausea No vomiting No abdominal pain No diarrhea No urinary symptoms No skin rash + fatigue No myalgias + headache    Physical Exam Triage Vital Signs ED Triage Vitals  Enc Vitals Group     BP 02/19/18 1234 112/72     Pulse Rate 02/19/18 1234 63     Resp 02/19/18 1234 16     Temp 02/19/18 1234 98.2 F (36.8 C)     Temp Source 02/19/18 1234 Oral     SpO2 02/19/18 1234 99 %     Weight 02/19/18 1235 178 lb (80.7 kg)     Height 02/19/18 1235 5\' 1"  (1.549 m)     Head Circumference --      Peak Flow --      Pain Score 02/19/18 1235 0     Pain Loc --      Pain Edu? --      Excl. in [place]? --    No data found.  Updated Vital Signs BP 112/72 (BP Location: Right Arm)   Pulse 63   Temp 98.2 F (36.8 C) (Oral)   Resp 16   Ht 5\' 1"   (1.549 m)   Wt 178 lb (80.7 kg)   SpO2 99%   BMI 33.63 kg/m   Visual Acuity Right Eye Distance:   Left Eye Distance:   Bilateral Distance:    Right Eye Near:   Left Eye Near:    Bilateral Near:     Physical Exam Nursing notes and Vital Signs reviewed. Appearance:  Patient appears stated age, and in no acute distress Eyes:  Pupils are equal, round, and reactive to light and accomodation.  Extraocular movement is intact.  Conjunctivae are not inflamed  Ears:  Canals normal.  Tympanic membranes normal.  Nose:  Mildly congested turbinates.  No sinus tenderness.   Pharynx:  Mildly erythematous Neck:  Supple.   Tender tonsillar nodes bilaterally.  Mild tenderness over thyroid without hypertrophy. Lungs:  Clear to auscultation.  Breath sounds are equal.  Moving air well. Heart:  Regular rate and rhythm without murmurs, rubs, or gallops.  Abdomen:  Nontender without masses or hepatosplenomegaly.  Bowel sounds are present.  No CVA or flank tenderness.  Extremities:  No edema.  Skin:  No rash present.    UC Treatments / Results  Labs (all labs ordered are listed, but only abnormal results are displayed) Labs Reviewed - No data to display  EKG None Radiology No results found.  Procedures Procedures (including critical care time)  Medications Ordered in UC Medications - No data to display   Initial Impression / Assessment and Plan / UC Course  I have reviewed the triage vital signs and the nursing notes.  Pertinent labs & imaging results that were available during my care of the patient were reviewed by me and considered in my medical decision making (see chart for details).    Suspect strep pharyngitis; begin Z-pak Begin prednisone burst/taper. Rx for albuterol MDI Recommend follow-up with PCP; ?thryoid inflammation.    Final Clinical Impressions(s) / UC Diagnoses   Final diagnoses:  Bronchospasm  Acute pharyngitis, unspecified etiology    ED Discharge Orders         Ordered    azithromycin (ZITHROMAX Z-PAK) 250 MG tablet     02/19/18 1339    predniSONE (DELTASONE) 20 MG tablet  02/19/18 1339    albuterol (PROVENTIL HFA;VENTOLIN HFA) 108 (90 Base) MCG/ACT inhaler  Every 4 hours PRN     02/19/18 1339         Lattie Haw, MD 02/27/18 1049

## 2018-02-19 NOTE — ED Triage Notes (Signed)
Patient moved to this country 2 years ago; over past one year she has had episodes of shortness of breath and tingling sensation in throat especially upon exertion. During one urgent care visit she was given a nebulizer treatment which seemed to help. No follow up due to lack of insurance. She also had bilateral tubal ligation 1-2 weeks ago following failed pregnancy.

## 2018-04-17 IMAGING — US US MFM OB DETAIL+14 WK
2 series · 14 of 28 positions shown · non-contrast
Comparison: none

[Series 1: us mfm ob detail+14 wk · 74 acquisitions, 11 frames shown (1 of 2)]
[im 4/74]
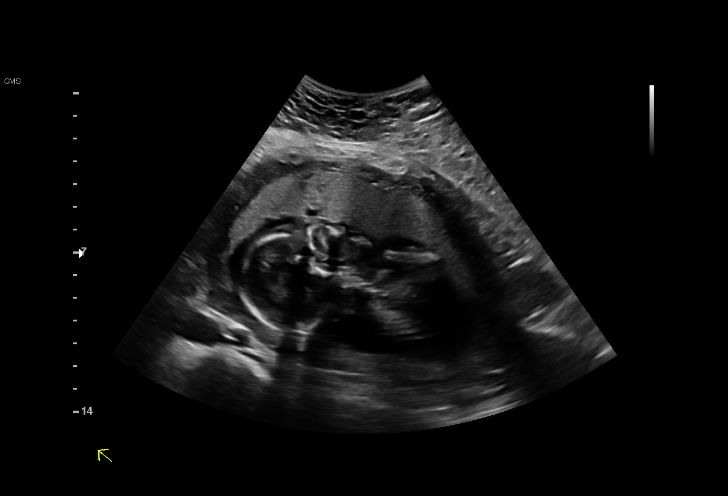
[im 10/74]
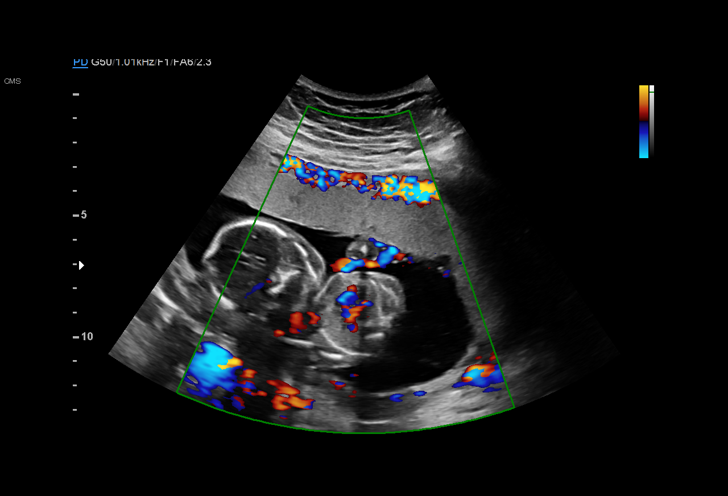
[im 17/74]
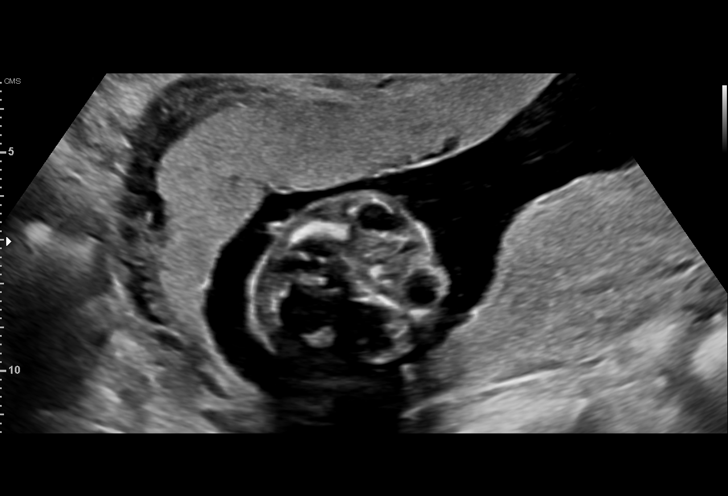
[im 24/74]
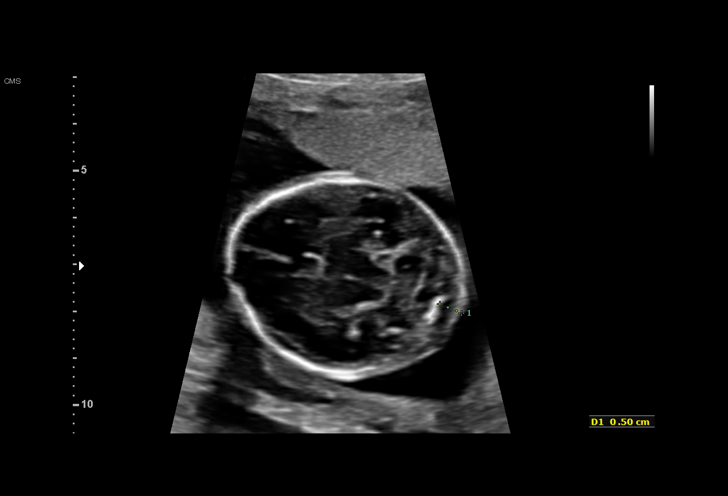
[im 30/74]
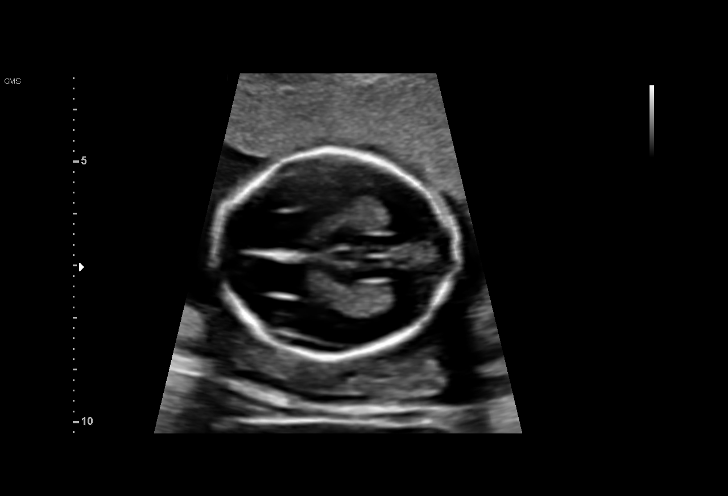
[im 37/74]
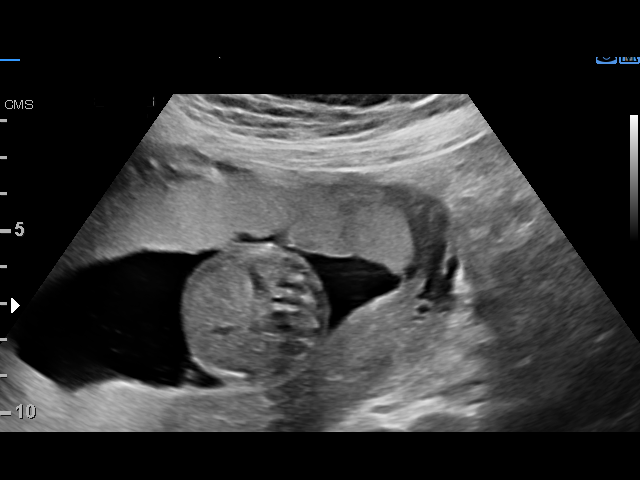
[im 44/74]
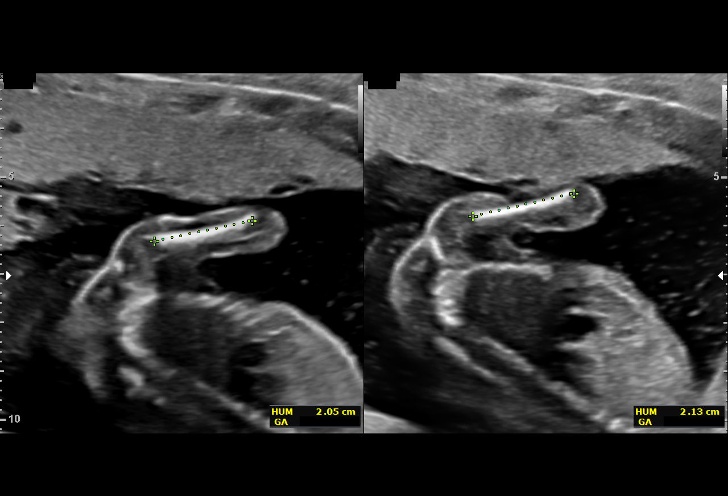
[im 50/74]
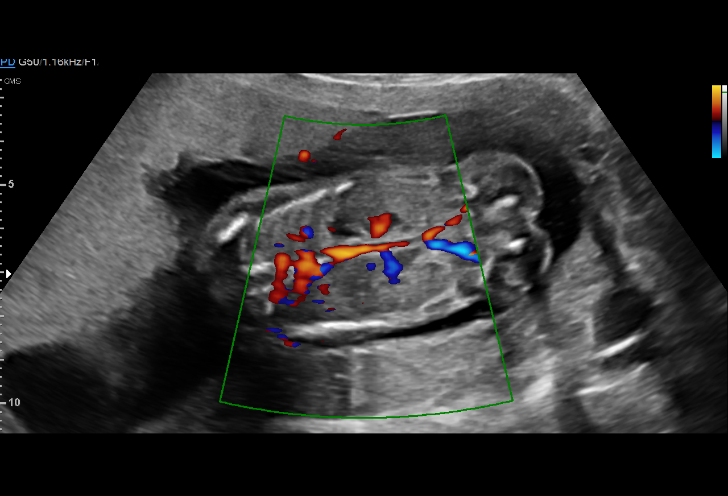
[im 57/74]
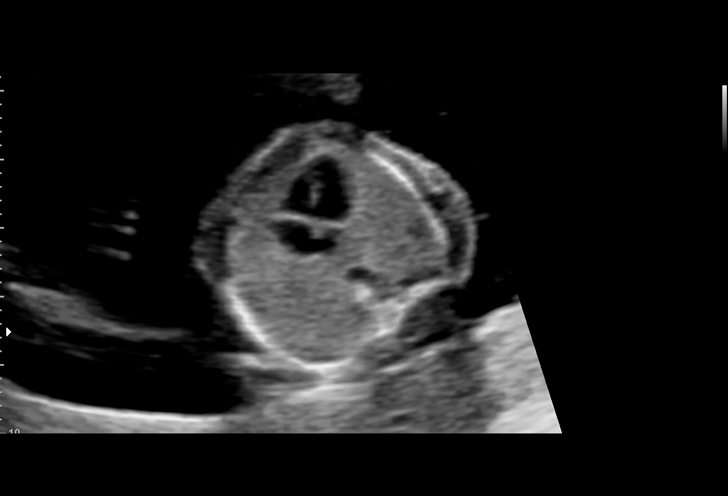
[im 64/74]
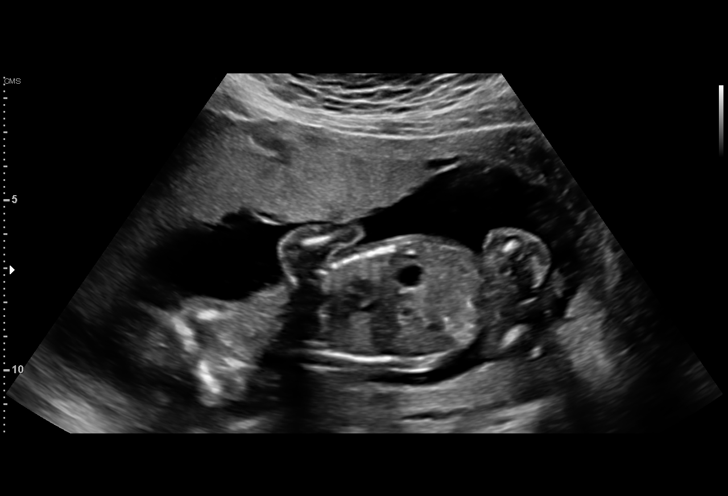
[im 70/74]
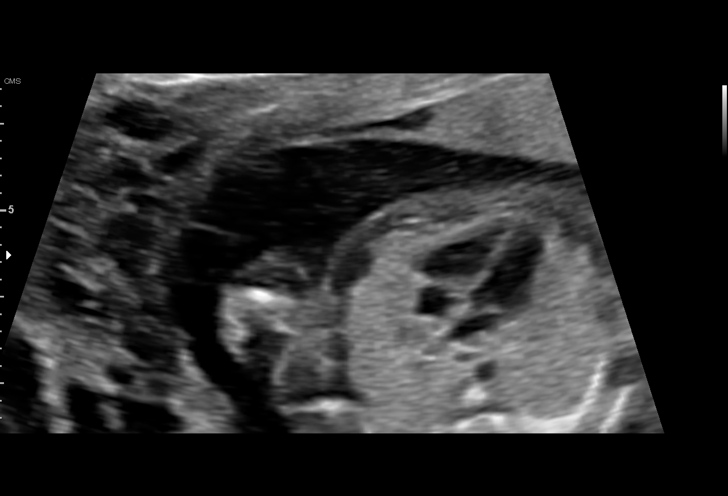

[Series 5: us mfm ob detail+14 wk · 15 acquisitions, 3 frames shown (2 of 2)]
[im 1/15]
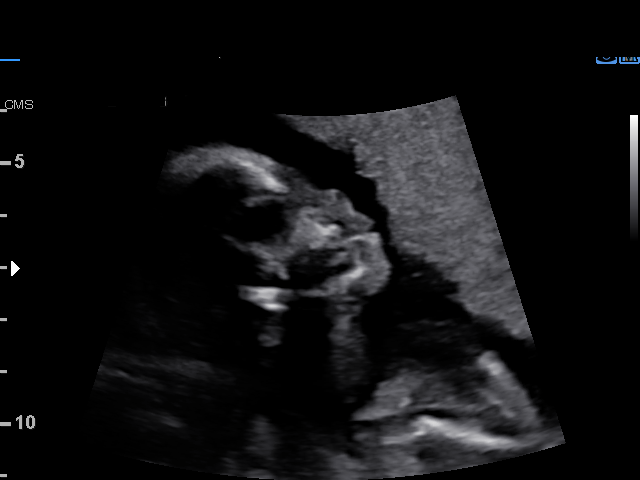
[im 8/15]
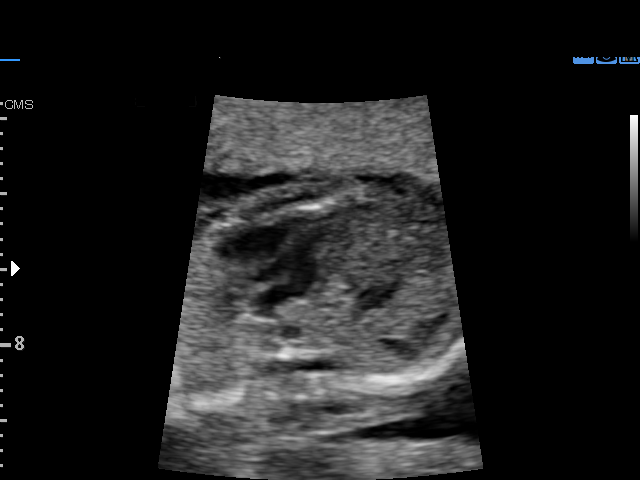
[im 15/15]
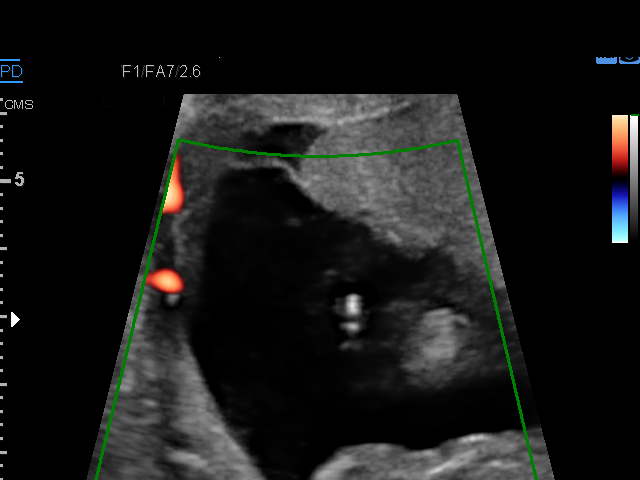

[14 of 28 positions shown; findings below may reference images not displayed]

[facility]

[facility]

1  KLEVER JUMPER              [id number]      [id number]     [id number]
Indications

18 weeks gestation of pregnancy
Encounter for fetal anatomic survey
Advanced maternal age multigravida (41),
second trimester
Gestational diabetes in pregnancy,
controlled by oral hypoglycemic drugs
Previous cesarean delivery, antepartum
Abnormal chromosomal and genetic finding
on antenatal screening of mother: high risk
NIPS for T21
OB History

Gravidity:    4         Term:   3        Prem:   0         SAB:   0
TOP:          0       Ectopic:  0        Living: 3
Fetal Evaluation

Num Of Fetuses:     1
Fetal Heart         157
Rate(bpm):
Cardiac Activity:   Observed
Presentation:       Breech
Placenta:           Anterior, above cervical os
P. Cord Insertion:  Visualized, central
Amniotic Fluid
AFI FV:      Subjectively within normal limits

Largest Pocket(cm)
4.49
Biometry

BPD:      40.7  mm     G. Age:  18w 2d         47  %    CI:         79.97  %    70 - 86
FL/HC:       15.4  %    15.8 - 18
HC:      143.8  mm     G. Age:  17w 4d         10  %    HC/AC:       1.13       1.07 -
AC:      127.8  mm     G. Age:  18w 3d         44  %    FL/BPD:      54.5  %
FL:       22.2  mm     G. Age:  16w 5d        < 3  %    FL/AC:       17.4  %    20 - 24
HUM:      20.9  mm     G. Age:  16w 2d        < 5  %
CER:      17.6  mm     G. Age:  17w 4d         27  %

Est. FW:     200   gm     0 lb 7 oz     29  %
Gestational Age

LMP:           18w 3d        Date:  05/16/17                 EDD:    02/20/18
U/S Today:     17w 5d                                        EDD:    02/25/18
Best:          18w 3d     Det. By:  LMP  (05/16/17)          EDD:    02/20/18
Anatomy

Cranium:               Appears normal         Aortic Arch:            Appears normal
Cavum:                 Appears normal         Ductal Arch:            Appears normal
Ventricles:            Appears normal         Diaphragm:              Appears normal
Choroid Plexus:        Appears normal         Stomach:                Appears normal, left
sided
Cerebellum:            Appears normal         Abdomen:                Appears normal
Posterior Fossa:       Appears normal         Abdominal Wall:         Appears nml (cord
insert, abd wall)
Nuchal Fold:           Appears normal         Cord Vessels:           Appears normal (3
vessel cord)
Face:                  Absent nasal bone      Kidneys:                Appear normal
Lips:                  Appears normal         Bladder:                Appears normal
Thoracic:              Appears normal         Spine:                  Appears normal
Heart:                 Appears normal         Upper Extremities:      Appears normal
(4CH, axis, and
situs)
RVOT:                  Appears normal         Lower Extremities:      Appears normal
LVOT:                  Appears normal

Other:  Fetus appears to be a female. Heels and right 5th digit visualized.
Technically difficult due to fetal position.
Cervix Uterus Adnexa

Cervix
Length:            4.4  cm.
Normal appearance by transabdominal scan.

Uterus
No abnormality visualized.

Left Ovary
Within normal limits.
Right Ovary
Within normal limits.

Cul De Sac:   No free fluid seen.

Adnexa:       No abnormality visualized.
Impression

SIUP at 18+3 weeks
Normal detailed fetal anatomy
Markers of aneuploidy: absent nasal bone; short long bones
Normal amniotic fluid volume
Measurements consistent with LMP dating

The US findings were shared with Ms. Perry Tinoco
and her husband. The implications of the above T21 markers
were discussed in detail. All of their prenatal testing and
pregnancy management options were reviewed again. After
careful consideration, Ms. Eld De An declined
amniocentesis and fetal ECHO.
Recommendations

Follow-up ultrasound for growth in 4-6 weeks

## 2018-05-03 IMAGING — US US MFM AMNIOCENTESIS
1 series · 13 of 15 positions shown · non-contrast
Comparison: none

[Series 1: us mfm amniocentesis · 15 acquisitions, 13 frames shown]
[im 1/15]
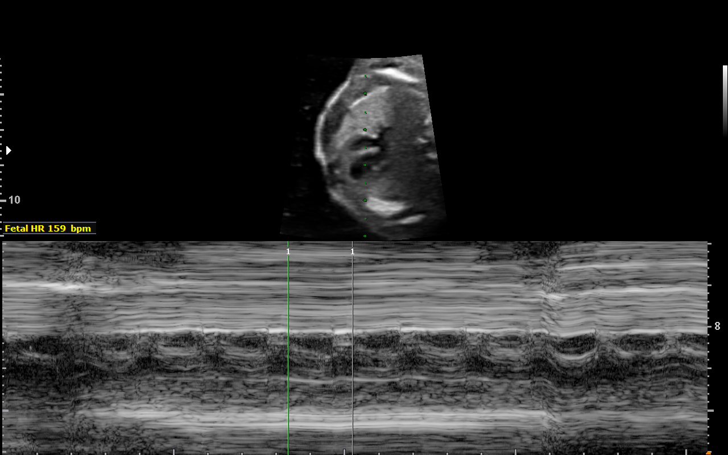
[im 2/15]
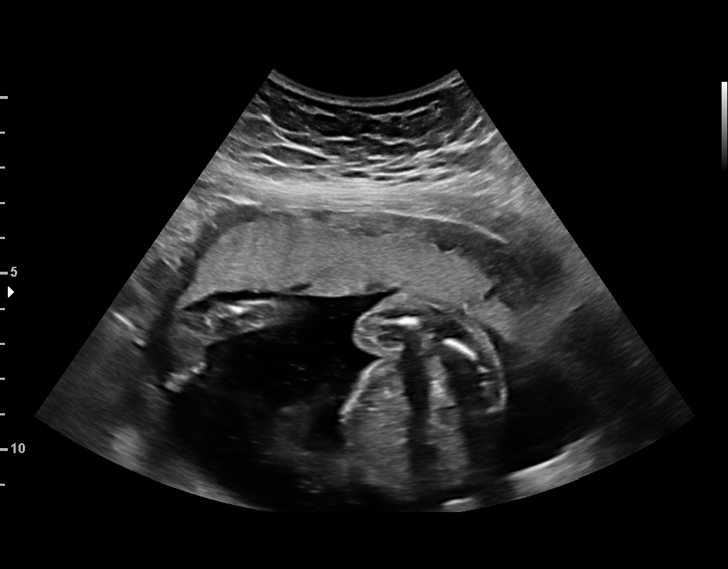
[im 3/15]
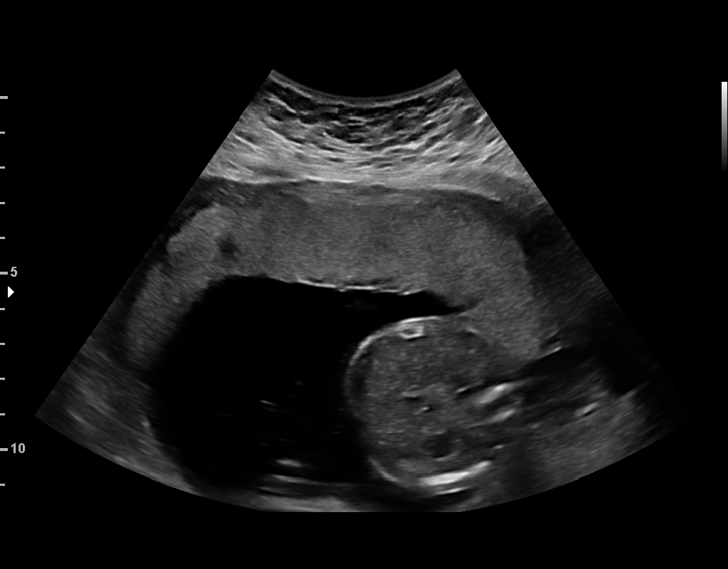
[im 5/15]
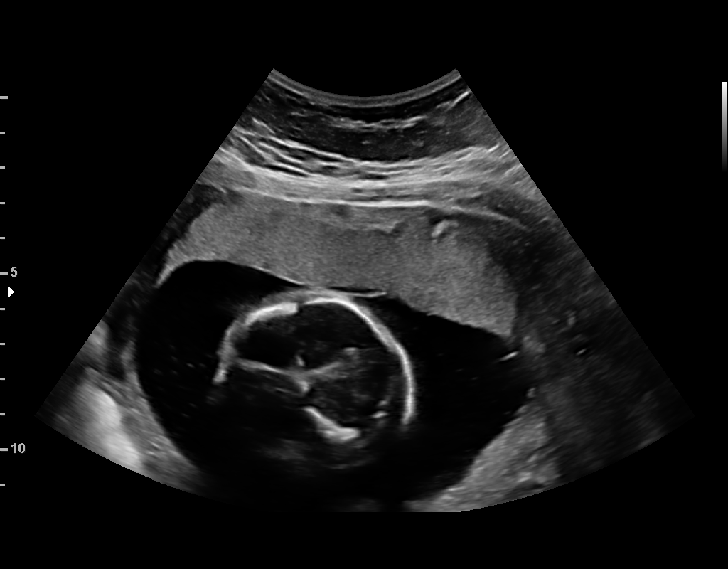
[im 6/15]
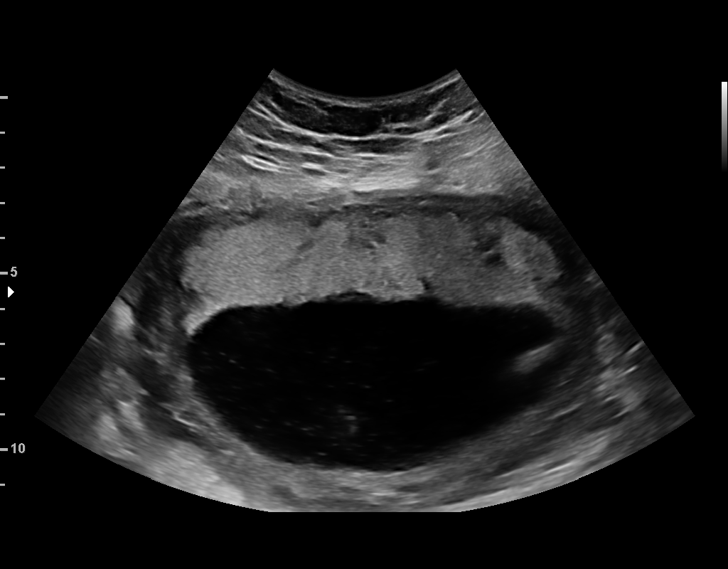
[im 7/15]
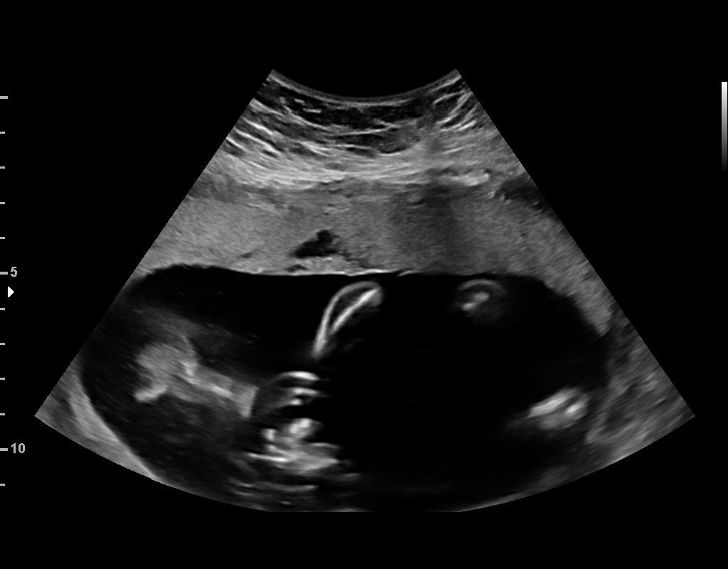
[im 8/15]
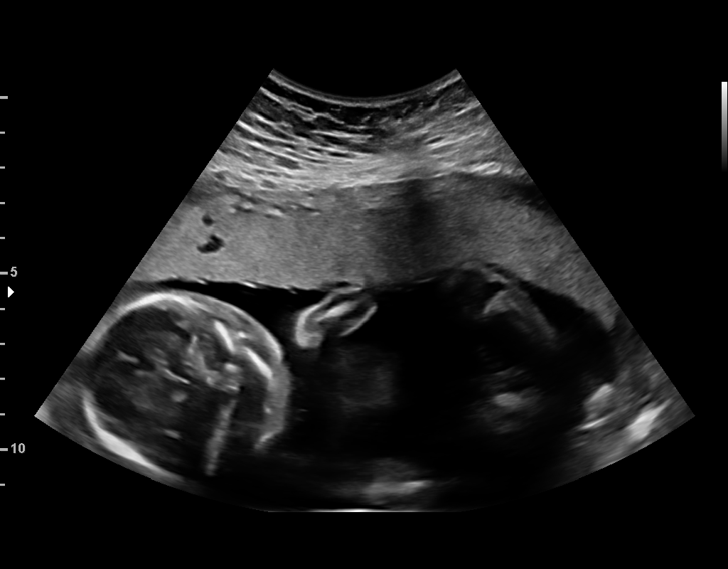
[im 9/15]
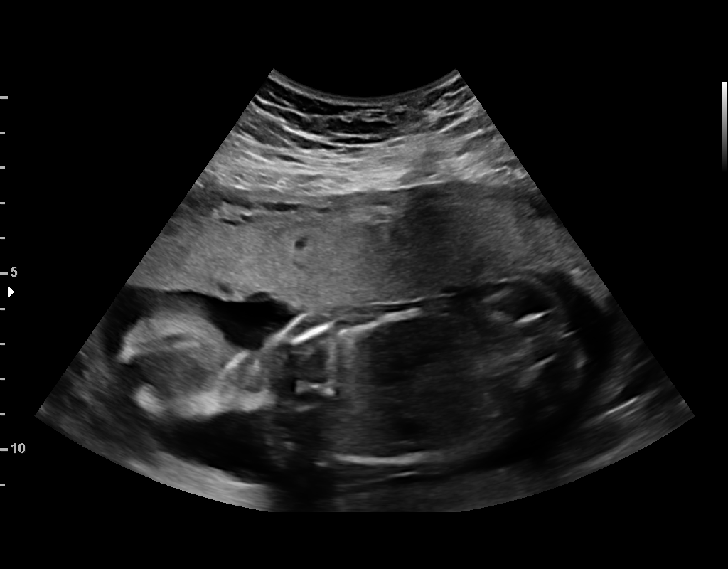
[im 10/15]
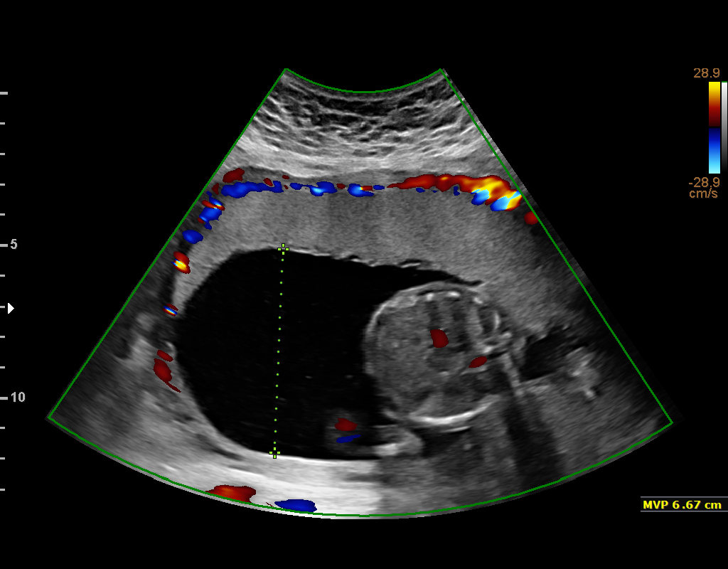
[im 11/15]
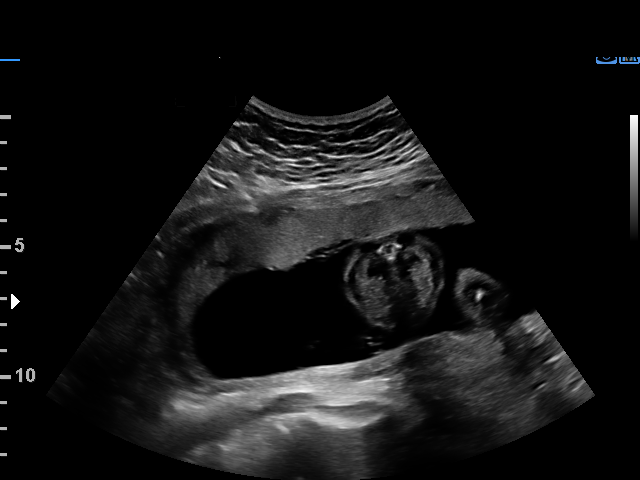
[im 13/15]
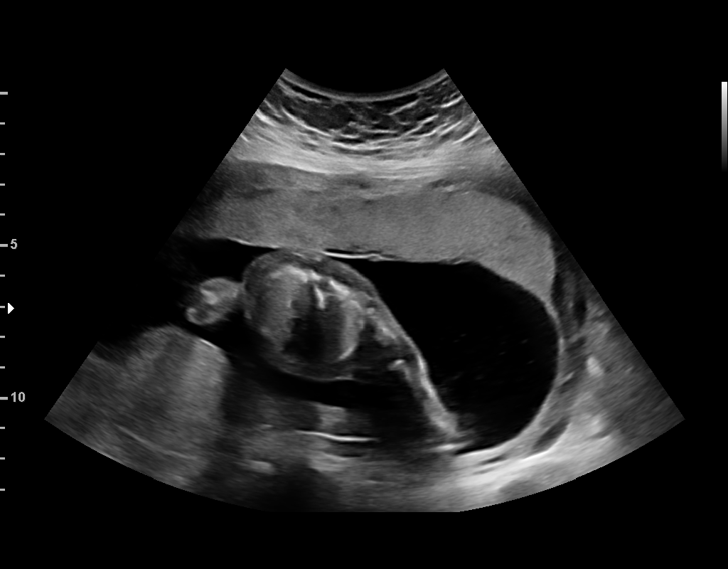
[im 14/15]
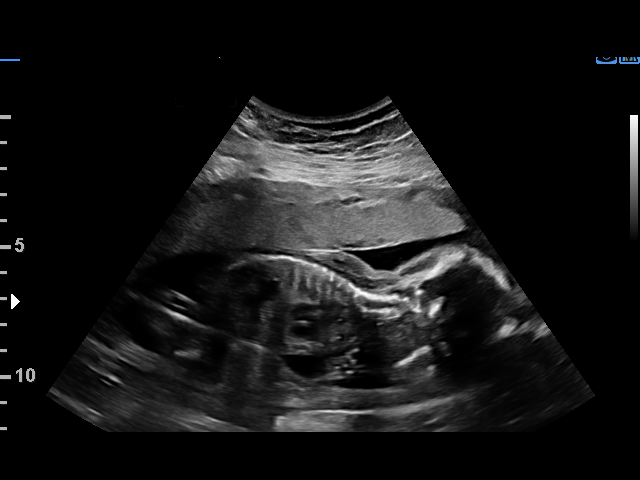
[im 15/15]
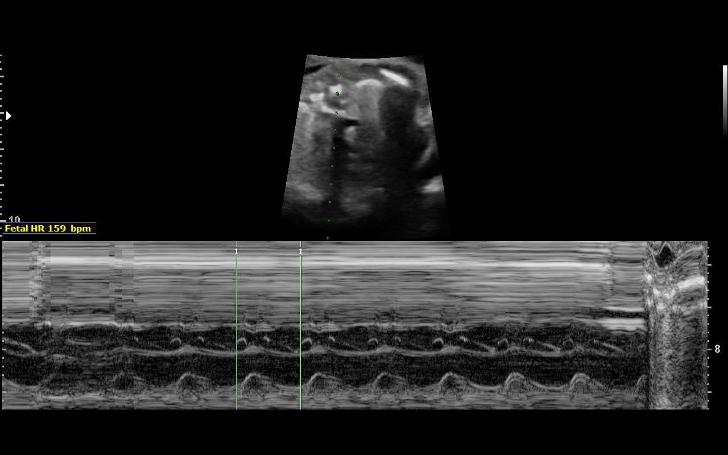

[13 of 15 positions shown; findings below may reference images not displayed]

[facility]

[facility]

1  RAFA YERA              [id number]      [id number]     [id number]
Indications

20 weeks gestation of pregnancy
Gestational diabetes in pregnancy,
controlled by oral hypoglycemic drugs
Advanced maternal age multigravida (41),
second trimester
Previous cesarean delivery, antepartum
Abnormal chromosomal and genetic finding
on antenatal screening of mother: high risk
NIPS for T21
OB History

Gravidity:    4         Term:   3        Prem:   0        SAB:   0
TOP:          0       Ectopic:  0        Living: 3
Fetal Evaluation

Num Of Fetuses:     1
Fetal Heart         159
Rate(bpm):
Cardiac Activity:   Observed
Placenta:           Anterior, above cervical os

Amniotic Fluid
AFI FV:      Subjectively within normal limits

Largest Pocket(cm)
6.67
Gestational Age

LMP:           20w 5d        Date:  05/16/17                 EDD:   02/20/18
Best:          20w 5d     Det. By:  LMP  (05/16/17)          EDD:   02/20/18
Guided Procedures

Type:   Amniocentesis for fetal genetic study

FH Post Procedure:     Normal             RH Type:          O+
Rh Immune Globulin:    Not required,      Discharge Inst.:  Post-procedure
Rh positive                          instructions
given
Needle Insertions:     20 gauge x 1       Vol. Withdrawn:   20 ml of clear
amniotic fluid
Transabdominal:        Yes

Complications:  None

Comment:                    Transversed placenta x1, no evidece of bleeding postprocedure
Impression

Successful amniocentesis to confirm NIPT high risk for
Trisomy 21
Recommendations

Will contact patient with results as soon as available

## 2020-02-13 ENCOUNTER — Other Ambulatory Visit: Payer: Self-pay

## 2020-02-13 ENCOUNTER — Emergency Department (INDEPENDENT_AMBULATORY_CARE_PROVIDER_SITE_OTHER)
Admission: EM | Admit: 2020-02-13 | Discharge: 2020-02-13 | Disposition: A | Discharge status: Home / Self Care | Payer: Self-pay | Source: Home / Self Care

## 2020-02-13 DIAGNOSIS — J4520 Mild intermittent asthma, uncomplicated: Secondary | ICD-10-CM

## 2020-02-13 MED ORDER — PREDNISONE 50 MG PO TABS: 50.0000 mg | ORAL_TABLET | Freq: Every day | ORAL | 0 refills | Status: AC

## 2020-02-13 MED ORDER — METHYLPREDNISOLONE SODIUM SUCC 40 MG IJ SOLR
80.0000 mg | Freq: Once | INTRAMUSCULAR | Status: AC
  Administered 2020-02-13: 18:00:00 80 mg via INTRAMUSCULAR

## 2020-02-13 MED ORDER — CETIRIZINE HCL 10 MG PO TABS: 10.0000 mg | ORAL_TABLET | Freq: Every day | ORAL | 0 refills | Status: DC

## 2020-02-13 MED ORDER — ALBUTEROL SULFATE HFA 108 (90 BASE) MCG/ACT IN AERS
2.0000 | INHALATION_SPRAY | Freq: Once | RESPIRATORY_TRACT | Status: AC
  Administered 2020-02-13: 18:00:00 2 via RESPIRATORY_TRACT

## 2020-02-13 MED ORDER — ALBUTEROL SULFATE HFA 108 (90 BASE) MCG/ACT IN AERS: 1.0000 | INHALATION_SPRAY | Freq: Four times a day (QID) | RESPIRATORY_TRACT | 1 refills | Status: DC | PRN

## 2020-02-13 NOTE — ED Triage Notes (Signed)
Patient presents to Urgent Care with complaints of shortness of breath and chest pain since earlier this afternoon while she was cleaning the house. Patient reports she went to the store and bought cough medication, states this has happened in the past but it was a long time ago.

## 2020-02-13 NOTE — ED Provider Notes (Addendum)
[facility]    CSN: [id number] Arrival date & time: 02/13/20  1700      History   Chief Complaint Chief Complaint  Patient presents with  . Chest Pain  . Shortness of Breath    HPI Victoria Russo is a 44 y.o. female.   Spanish interpreter used throughout pt's UC visit.  HPI  Victoria Russo is a 44 y.o. female presenting to UC with c/o gradually worsening chest tightness and SOB that started about 2-3 hours PTA while she was cleaning her house this afternoon. She developed a dry cough as well so she tried OTC cough medication but no relief. Hx of similar symptoms in the past. An inhaler and steroids have helped but the inhaler she has at home is out of date so she did not try it.  Denies fever, chills, n/v/d. No known sick contacts.    Past Medical History:  Diagnosis Date  . Bronchitis   . Gestational diabetes   . UTI (urinary tract infection)     Patient Active Problem List   Diagnosis Date Noted  . Obesity (BMI 30-39.9) 10/07/2017  . Obesity in pregnancy 10/07/2017  . Language barrier 10/07/2017    Past Surgical History:  Procedure Laterality Date  . CESAREAN SECTION    . TUBAL LIGATION      OB History    Gravida  4   Para  3   Term  1   Preterm      AB      Living  3     SAB      TAB      Ectopic      Multiple      Live Births  3            Home Medications    Prior to Admission medications   Medication Sig Start Date End Date Taking? Authorizing Provider  albuterol (VENTOLIN HFA) 108 (90 Base) MCG/ACT inhaler Inhale 1-2 puffs into the lungs every 6 (six) hours as needed for wheezing or shortness of breath. 02/13/20   Lurene Shadow, PA-C  azithromycin (ZITHROMAX Z-PAK) 250 MG tablet Take 2 tabs today; then begin one tab once daily for 4 more days. 02/19/18   Lattie Haw, MD  cetirizine (ZYRTEC) 10 MG tablet Take 1 tablet (10 mg total) by mouth daily. 02/13/20   Lurene Shadow, PA-C  ibuprofen  (ADVIL,MOTRIN) 200 MG tablet Take 200 mg by mouth every 8 (eight) hours as needed.    [provider]  predniSONE (DELTASONE) 50 MG tablet Take 1 tablet (50 mg total) by mouth daily with breakfast for 5 days. 02/13/20 02/18/20  Lurene Shadow, PA-C  Prenatal Multivit-Min-Fe-FA (PRENATAL VITAMINS PO) Take by mouth.    [provider]  Prenatal Multivit-Min-Fe-FA (PRENATAL VITAMINS) 0.8 MG tablet Take 1 tablet by mouth daily. 08/11/17   Reva Bores, MD    Family History Family History  Problem Relation Age of Onset  . Cancer Mother 56       endometrial  . Diabetes Maternal Aunt     Social History Social History   Tobacco Use  . Smoking status: Never Smoker  . Smokeless tobacco: Never Used  Substance Use Topics  . Alcohol use: Yes  . Drug use: No     Allergies   Patient has no known allergies.   Review of Systems Review of Systems  Constitutional: Negative for chills and fever.  HENT: Negative for congestion, ear pain,  sore throat, trouble swallowing and voice change.   Respiratory: Positive for cough ( dry), chest tightness, shortness of breath and wheezing.   Cardiovascular: Negative for chest pain and palpitations.  Gastrointestinal: Negative for abdominal pain, diarrhea, nausea and vomiting.  Musculoskeletal: Negative for arthralgias, back pain and myalgias.  Skin: Negative for rash.  All other systems reviewed and are negative.    Physical Exam Triage Vital Signs ED Triage Vitals  Enc Vitals Group     BP 02/13/20 1712 129/84     Pulse Rate 02/13/20 1712 93     Resp 02/13/20 1712 (!) 26     Temp 02/13/20 1712 98.9 F (37.2 C)     Temp Source 02/13/20 1712 Oral     SpO2 02/13/20 1712 93 %     Weight --      Height --      Head Circumference --      Peak Flow --      Pain Score 02/13/20 1710 9     Pain Loc --      Pain Edu? --      Excl. in [place]? --    No data found.  Updated Vital Signs BP 129/84 (BP Location: Left Arm)   Pulse 93    Temp 98.9 F (37.2 C) (Oral)   Resp 20   SpO2 96%   Visual Acuity Right Eye Distance:   Left Eye Distance:   Bilateral Distance:    Right Eye Near:   Left Eye Near:    Bilateral Near:     Physical Exam Vitals and nursing note reviewed.  Constitutional:      Appearance: She is well-developed. She is not ill-appearing, toxic-appearing or diaphoretic.     Comments: Pt sitting on exam table, nasal canula in place, appears short of breath but alert and cooperative during exam  HENT:     Head: Normocephalic and atraumatic.     Right Ear: Tympanic membrane and ear canal normal.     Left Ear: Tympanic membrane and ear canal normal.     Nose: Nose normal.     Right Sinus: No maxillary sinus tenderness or frontal sinus tenderness.     Left Sinus: No maxillary sinus tenderness or frontal sinus tenderness.     Mouth/Throat:     Lips: Pink.     Mouth: Mucous membranes are moist.     Pharynx: Oropharynx is clear. Uvula midline.  Cardiovascular:     Rate and Rhythm: Normal rate.  Pulmonary:     Effort: Pulmonary effort is normal.     Breath sounds: Wheezing ( diffuse inspiratory and expiratory) present. No decreased breath sounds, rhonchi or rales.     Comments: Short of breath between sentences even with nasal canula in place at 2L. Musculoskeletal:        General: Normal range of motion.     Cervical back: Normal range of motion.  Skin:    General: Skin is warm and dry.  Neurological:     Mental Status: She is alert and oriented to person, place, and time.  Psychiatric:        Behavior: Behavior normal.      UC Treatments / Results  Labs (all labs ordered are listed, but only abnormal results are displayed) Labs Reviewed - No data to display  EKG Date/Time:02/13/2020    17:19:25 Ventricular Rate: 95 PR Interval: 128 QRS Duration: 72 QT Interval: 358 QTC Calculation: 449 P-R-T axes: 83   86  47 Text Interpretation: Normal sinus rhythm. Normal  ECG    Radiology No results found.  Procedures Procedures (including critical care time)  Medications Ordered in UC Medications  methylPREDNISolone sodium succinate (SOLU-MEDROL) 40 mg/mL injection 80 mg (80 mg Intramuscular Given 02/13/20 1740)  albuterol (VENTOLIN HFA) 108 (90 Base) MCG/ACT inhaler 2 puff (2 puffs Inhalation Given 02/13/20 1740)    Initial Impression / Assessment and Plan / UC Course  I have reviewed the triage vital signs and the nursing notes.  Pertinent labs & imaging results that were available during my care of the patient were reviewed by me and considered in my medical decision making (see chart for details).     Hx and exam c/w asthma exacerbation Pt given albuterol inhaler and solumedrol 80mg  IM in UC O2 Sat improved to 96% on RA Pt able to speak in full sentences w/o difficulty  Wheezing nearly fully resolved. Pt feels better and feels comfortable being discharged home. Doubt covid or other infection process at this time AVS provided  Final Clinical Impressions(s) / UC Diagnoses   Final diagnoses:  Mild intermittent asthma without complication   Discharge Instructions   Spanish discharge information/patient education packet on asthma provided.     ED Prescriptions    Medication Sig Dispense Auth. Provider   albuterol (VENTOLIN HFA) 108 (90 Base) MCG/ACT inhaler Inhale 1-2 puffs into the lungs every 6 (six) hours as needed for wheezing or shortness of breath. 8 g , Adilyn Humes O, PA-C   cetirizine (ZYRTEC) 10 MG tablet Take 1 tablet (10 mg total) by mouth daily. 30 tablet 05-13-1982, Araly Kaas O, PA-C   predniSONE (DELTASONE) 50 MG tablet Take 1 tablet (50 mg total) by mouth daily with breakfast for 5 days. 5 tablet 06-14-1987, PA-C     PDMP not reviewed this encounter.   Lurene Shadow, PA-C 02/13/20 1854    02/15/20, PA-C 02/13/20 2018

## 2021-02-21 ENCOUNTER — Emergency Department
Admission: EM | Admit: 2021-02-21 | Discharge: 2021-02-21 | Disposition: A | Discharge status: Home / Self Care | Payer: Self-pay | Source: Home / Self Care | Attending: Family Medicine | Admitting: Family Medicine

## 2021-02-21 ENCOUNTER — Encounter: Payer: Self-pay | Admitting: Emergency Medicine

## 2021-02-21 ENCOUNTER — Other Ambulatory Visit: Payer: Self-pay

## 2021-02-21 DIAGNOSIS — J9801 Acute bronchospasm: Secondary | ICD-10-CM

## 2021-02-21 DIAGNOSIS — K219 Gastro-esophageal reflux disease without esophagitis: Secondary | ICD-10-CM

## 2021-02-21 MED ORDER — OMEPRAZOLE 40 MG PO CPDR: DELAYED_RELEASE_CAPSULE | ORAL | 1 refills | Status: DC

## 2021-02-21 MED ORDER — QVAR REDIHALER 40 MCG/ACT IN AERB: INHALATION_SPRAY | RESPIRATORY_TRACT | 1 refills | Status: DC

## 2021-02-21 NOTE — ED Triage Notes (Addendum)
Wheezing last night while eating dinner, had cough for 3 weeks in the middle of the night only. Feels better but still feels wheezing and compression chest. Was told she has asthma. Had endoscopy 2 years ago with negative results

## 2021-02-21 NOTE — ED Provider Notes (Signed)
[facility]    CSN: [id number] Arrival date & time: 02/21/21  1052      History   Chief Complaint Chief Complaint  Patient presents with  . Wheezing    HPI Victoria Russo is a 45 y.o. female.   Patient is Spanish speaking and interview is facilitated with an interpretor. Patient complains of a persistent cough that has occurred only during the night during the past 3 weeks.  Last night she developed wheezing which has now improved.  She denies chest pain or shortness of breath, but admits that she has a pressure sensation in her upper chest when she eats.  She denies nausea/vomiting.  She has been told in the past that she has asthma. She reports that she had endoscopy two years ago that was negative. Patient had a visit with her endocrinologist this morning for evaluation of recent weight gain. Review of records reveals that she received an Rx for single dose of Decadron prior to lab tests scheduled for 02/24/21.  The history is provided by the patient. The history is limited by a language barrier. A language interpreter was used.    Past Medical History:  Diagnosis Date  . Bronchitis   . Gestational diabetes   . UTI (urinary tract infection)     Patient Active Problem List   Diagnosis Date Noted  . Obesity (BMI 30-39.9) 10/07/2017  . Obesity in pregnancy 10/07/2017  . Language barrier 10/07/2017    Past Surgical History:  Procedure Laterality Date  . CESAREAN SECTION    . TUBAL LIGATION      OB History    Gravida  4   Para  3   Term  1   Preterm      AB      Living  3     SAB      IAB      Ectopic      Multiple      Live Births  3            Home Medications    Prior to Admission medications   Medication Sig Start Date End Date Taking? Authorizing Provider  beclomethasone (QVAR REDIHALER) 40 MCG/ACT inhaler Inhale 2 puffs into the lungs every 12 hours 02/21/21  Yes Amberle Lyter, Tera Mater, MD   dextromethorphan-guaiFENesin [facility] DM) 30-600 MG 12hr tablet Take 1 tablet by mouth 2 (two) times daily.   Yes [provider]  omeprazole (PRILOSEC) 40 MG capsule Take one cap PO once daily, 30 minutes before evening meal 02/21/21  Yes Berthold Glace, Tera Mater, MD  ibuprofen (ADVIL,MOTRIN) 200 MG tablet Take 200 mg by mouth every 8 (eight) hours as needed.    [provider]    Family History Family History  Problem Relation Age of Onset  . Cancer Mother 74       endometrial  . Diabetes Maternal Aunt     Social History Social History   Tobacco Use  . Smoking status: Never Smoker  . Smokeless tobacco: Never Used  Vaping Use  . Vaping Use: Never used  Substance Use Topics  . Alcohol use: Yes  . Drug use: No     Allergies   Patient has no known allergies.   Review of Systems Review of Systems  Constitutional: Positive for unexpected weight change. Negative for activity change, appetite change, chills, diaphoresis, fatigue and fever.  HENT: Negative.   Eyes: Negative.   Respiratory: Positive for cough, chest tightness and wheezing. Negative for  shortness of breath and stridor.   Cardiovascular: Negative for palpitations and leg swelling.  Gastrointestinal: Negative for abdominal distention, abdominal pain, blood in stool, constipation, diarrhea, nausea and vomiting.  Genitourinary: Negative.   Musculoskeletal: Negative.   Skin: Negative.   Neurological: Negative for headaches.  Hematological: Negative for adenopathy.     Physical Exam Triage Vital Signs ED Triage Vitals  Enc Vitals Group     BP 02/21/21 1118 134/79     Pulse Rate 02/21/21 1118 67     Resp 02/21/21 1118 16     Temp 02/21/21 1118 98.8 F (37.1 C)     Temp Source 02/21/21 1118 Oral     SpO2 02/21/21 1118 97 %     Weight 02/21/21 1132 185 lb (83.9 kg)     Height 02/21/21 1132 5\' 1"  (1.549 m)     Head Circumference --      Peak Flow --      Pain Score 02/21/21 1129 9     Pain Loc  --      Pain Edu? --      Excl. in GC? --    No data found.  Updated Vital Signs BP 134/79 (BP Location: Right Arm)   Pulse 67   Temp 98.8 F (37.1 C) (Oral)   Resp 16   Ht 5\' 1"  (1.549 m)   Wt 83.9 kg   SpO2 97%   BMI 34.96 kg/m   Visual Acuity Right Eye Distance:   Left Eye Distance:   Bilateral Distance:    Right Eye Near:   Left Eye Near:    Bilateral Near:     Physical Exam Vitals and nursing note reviewed.  Constitutional:      General: She is not in acute distress.    Appearance: She is obese. She is not ill-appearing.  HENT:     Head: Normocephalic.     Nose: Nose normal.     Mouth/Throat:     Mouth: Mucous membranes are moist.     Pharynx: Oropharynx is clear.  Eyes:     Conjunctiva/sclera: Conjunctivae normal.     Pupils: Pupils are equal, round, and reactive to light.  Cardiovascular:     Rate and Rhythm: Normal rate and regular rhythm.     Heart sounds: Normal heart sounds.  Pulmonary:     Effort: Pulmonary effort is normal. No respiratory distress.     Breath sounds: Wheezing present. No rales.     Comments: Faint expiratory wheezes all fields. Chest:     Chest wall: No tenderness.  Abdominal:     General: Abdomen is protuberant. Bowel sounds are normal. There is no distension.     Palpations: Abdomen is soft. There is no hepatomegaly, splenomegaly or mass.     Tenderness: There is no abdominal tenderness.  Musculoskeletal:        General: No tenderness.     Cervical back: Neck supple.     Right lower leg: No edema.     Left lower leg: No edema.  Lymphadenopathy:     Cervical: No cervical adenopathy.  Skin:    General: Skin is warm and dry.     Findings: No rash.  Neurological:     Mental Status: She is alert and oriented to person, place, and time.      UC Treatments / Results  Labs (all labs ordered are listed, but only abnormal results are displayed) Labs Reviewed - No data to display  EKG  Radiology No results  found.  Procedures Procedures (including critical care time)  Medications Ordered in UC Medications - No data to display  Initial Impression / Assessment and Plan / UC Course  I have reviewed the triage vital signs and the nursing notes.  Pertinent labs & imaging results that were available during my care of the patient were reviewed by me and considered in my medical decision making (see chart for details).    Suspect that patient's wheezing exacerbated by her GERD. Begin omeprazole 40mg  daily, and QVAR Redihaler. Followup with Family Doctor if not improved in two weeks.    Final Clinical Impressions(s) / UC Diagnoses   Final diagnoses:  Cough due to bronchospasm  Gastroesophageal reflux disease, unspecified whether esophagitis present   Discharge Instructions   None    ED Prescriptions    Medication Sig Dispense Auth. Provider   omeprazole (PRILOSEC) 40 MG capsule Take one cap PO once daily, 30 minutes before evening meal 30 capsule , MD   beclomethasone (QVAR REDIHALER) 40 MCG/ACT inhaler Inhale 2 puffs into the lungs every 12 hours 1 each Lattie Haw, MD        Lattie Haw, MD 02/23/21 1349

## 2021-03-26 DIAGNOSIS — Z860100 Personal history of colon polyps, unspecified: Secondary | ICD-10-CM | POA: Insufficient documentation

## 2022-02-24 ENCOUNTER — Emergency Department
Admission: EM | Admit: 2022-02-24 | Discharge: 2022-02-24 | Disposition: A | Discharge status: Home / Self Care | Payer: Self-pay | Source: Home / Self Care | Attending: Family Medicine | Admitting: Family Medicine

## 2022-02-24 DIAGNOSIS — J209 Acute bronchitis, unspecified: Secondary | ICD-10-CM

## 2022-02-24 LAB — POCT RAPID STREP A (OFFICE): Rapid Strep A Screen: NEGATIVE

## 2022-02-24 MED ORDER — DOXYCYCLINE HYCLATE 100 MG PO CAPS: 100.0000 mg | ORAL_CAPSULE | Freq: Two times a day (BID) | ORAL | 0 refills | Status: DC

## 2022-02-24 MED ORDER — PREDNISONE 20 MG PO TABS: ORAL_TABLET | ORAL | 0 refills | Status: DC

## 2022-02-24 MED ORDER — METHYLPREDNISOLONE SODIUM SUCC 125 MG IJ SOLR
80.0000 mg | Freq: Once | INTRAMUSCULAR | Status: AC
  Administered 2022-02-24: 80 mg via INTRAMUSCULAR

## 2022-02-24 MED ORDER — ALBUTEROL SULFATE HFA 108 (90 BASE) MCG/ACT IN AERS: 2.0000 | INHALATION_SPRAY | Freq: Four times a day (QID) | RESPIRATORY_TRACT | 2 refills | Status: DC | PRN

## 2022-02-24 NOTE — ED Triage Notes (Addendum)
Pt presents to Urgent Care with c/o sore throat and cough x 2 weeks, afebrile. Also c/o abdominal bloating which she states is causing her to cough.  ?

## 2022-02-24 NOTE — Discharge Instructions (Addendum)
Begin prednisone Wednesday 02/25/22. ?Take plain guaifenesin (1200mg  extended release tabs such as Mucinex) twice daily, with plenty of water, for cough and congestion.  Get adequate rest.   ?May take Delsym Cough Suppressant ("12 Hour Cough Relief") at bedtime for nighttime cough.  ?Try warm salt water gargles for sore throat.  ?  ?

## 2022-02-24 NOTE — ED Provider Notes (Signed)
?[facility] ? ? ? ?CSN: [id number] ?Arrival date & time: 02/24/22  1859 ? ? ?  ? ?History   ?Chief Complaint ?Chief Complaint  ?Patient presents with  ? Cough  ? Sore Throat  ? Bloated  ? ? ?HPI ?Victoria Russo is a 46 y.o. female.  ? 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 ?She complains of a persistent cough for two weeks, now with wheezing and shortness of breath.  She has an albuterol inhaler which she Is using more often.  She denies pleuritic pain or fever, but complains of abdominal bloating (without pain) that she feels is contributing to her cough. ? ?The history is provided by the patient and the spouse. The history is limited by a language barrier. No language interpreter was used.  ? ?Past Medical History:  ?Diagnosis Date  ? Bronchitis   ? Gestational diabetes   ? UTI (urinary tract infection)   ? ? ?Patient Active Problem List  ? Diagnosis Date Noted  ? Obesity (BMI 30-39.9) 10/07/2017  ? Obesity in pregnancy 10/07/2017  ? Language barrier 10/07/2017  ? ? ?Past Surgical History:  ?Procedure Laterality Date  ? CESAREAN SECTION    ? TUBAL LIGATION    ? ? ?OB History   ? ? Gravida  ?4  ? Para  ?3  ? Term  ?1  ? Preterm  ?   ? AB  ?   ? Living  ?3  ?  ? ? SAB  ?   ? IAB  ?   ? Ectopic  ?   ? Multiple  ?   ? Live Births  ?3  ?   ?  ?  ? ? ? ?Home Medications   ? ?Prior to Admission medications   ?Medication Sig Start Date End Date Taking? Authorizing Provider  ?albuterol (VENTOLIN HFA) 108 (90 Base) MCG/ACT inhaler Inhale 2 puffs into the lungs every 6 (six) hours as needed for wheezing or shortness of breath. 02/24/22  Yes Baneza Bartoszek, Tera Mater, MD  ?doxycycline (VIBRAMYCIN) 100 MG capsule Take 1 capsule (100 mg total) by mouth 2 (two) times daily. 02/24/22  Yes Lattie Haw, MD  ?guaiFENesin (ROBITUSSIN) 100 MG/5ML liquid Take 5 mLs by mouth every 4 (four) hours as needed for cough or to loosen phlegm.   Yes [provider]  ?Peppermint Oil (IBGARD PO)  Take by mouth.   Yes [provider]  ?predniSONE (DELTASONE) 20 MG tablet Take one tab by mouth twice daily for 4 days, then one daily for 3 days. Take with food. 02/24/22  Yes Lattie Haw, MD  ?beclomethasone (QVAR REDIHALER) 40 MCG/ACT inhaler Inhale 2 puffs into the lungs every 12 hours 02/21/21   Lattie Haw, MD  ?dextromethorphan-guaiFENesin [facility] DM) 30-600 MG 12hr tablet Take 1 tablet by mouth 2 (two) times daily.    [provider]  ?ibuprofen (ADVIL,MOTRIN) 200 MG tablet Take 200 mg by mouth every 8 (eight) hours as needed.    [provider]  ?omeprazole (PRILOSEC) 40 MG capsule Take one cap PO once daily, 30 minutes before evening meal 02/21/21   Lattie Haw, MD  ? ? ?Family History ?Family History  ?Problem Relation Age of Onset  ? Cancer Mother 43  ?     endometrial  ? Diabetes Maternal Aunt   ? ? ?Social History ?Social History  ? ?Tobacco Use  ? Smoking status: Never  ? Smokeless tobacco: Never  ?Vaping Use  ?  Vaping Use: Never used  ?Substance Use Topics  ? Alcohol use: Yes  ?  Comment: occasionally  ? Drug use: No  ? ? ? ?Allergies   ?Patient has no known allergies. ? ? ?Review of Systems ?Review of Systems ?No sore throat ?+ cough ?No pleuritic pain ?+ wheezing ?+ nasal congestion ?+ post-nasal drainage ?No sinus pain/pressure ?No itchy/red eyes ?No earache ?No hemoptysis ?+ SOB ?No fever/chills ?No nausea ?No vomiting ?No abdominal pain; + abdominal bloating ?No diarrhea ?No urinary symptoms ?No skin rash ?+ fatigue ?No myalgias ?No headache ? ? ?Physical Exam ?Triage Vital Signs ?ED Triage Vitals  ?Enc Vitals Group  ?   BP 02/24/22 1923 139/88  ?   Pulse Rate 02/24/22 1923 72  ?   Resp 02/24/22 1923 20  ?   Temp 02/24/22 1923 98.3 ?F (36.8 ?C)  ?   Temp src --   ?   SpO2 02/24/22 1923 95 %  ?   Weight 02/24/22 1915 186 lb (84.4 kg)  ?   Height 02/24/22 1915 5\' 1"  (1.549 m)  ?   Head Circumference --   ?   Peak Flow --   ?   Pain Score 02/24/22 1915 10  ?    Pain Loc --   ?   Pain Edu? --   ?   Excl. in GC? --   ? ?No data found. ? ?Updated Vital Signs ?BP 139/88 (BP Location: Right Arm)   Pulse 72   Temp 98.3 ?F (36.8 ?C)   Resp 20   Ht 5\' 1"  (1.549 m)   Wt 84.4 kg   LMP 02/23/2022 (Exact Date)   SpO2 95%   BMI 35.14 kg/m?  ? ?Visual Acuity ?Right Eye Distance:   ?Left Eye Distance:   ?Bilateral Distance:   ? ?Right Eye Near:   ?Left Eye Near:    ?Bilateral Near:    ? ?Physical Exam ?Nursing notes and Vital Signs reviewed. ?Appearance:  Patient appears stated age, and in no acute distress ?Eyes:  Pupils are equal, round, and reactive to light and accomodation.  Extraocular movement is intact.  Conjunctivae are not inflamed  ?Ears:  Canals normal.  Tympanic membranes normal.  ?Nose:  Mildly congested turbinates.  No sinus tenderness.   ?Pharynx:  Normal ?Neck:  Supple.  Mildly enlarged lateral nodes are present, tender to palpation on the right.  ?Lungs:  Diffuse bilateral wheezes and rhonchi.  Breath sounds are equal.    ?Heart:  Regular rate and rhythm without murmurs, rubs, or gallops.  ?Abdomen:  Nontender without masses or hepatosplenomegaly.  Bowel sounds are present.  No CVA or flank tenderness.  ?Extremities:  No edema.  ?Skin:  No rash present. ? ? ?UC Treatments / Results  ?Labs ?(all labs ordered are listed, but only abnormal results are displayed) ?Labs Reviewed  ?POCT RAPID STREP A (OFFICE) negative  ? ? ?EKG ? ? ?Radiology ?No results found. ? ?Procedures ?Procedures (including critical care time) ? ?Medications Ordered in UC ?Medications  ?methylPREDNISolone sodium succinate (SOLU-MEDROL) 125 mg/2 mL injection 80 mg (80 mg Intramuscular Given 02/24/22 2109)  ? ? ?Initial Impression / Assessment and Plan / UC Course  ?I have reviewed the triage vital signs and the nursing notes. ? ?Pertinent labs & imaging results that were available during my care of the patient were reviewed by me and considered in my medical decision making (see chart for  details). ? ?  ?Administered Solumedrol 80mg  IM. Begin prednisone  burst/taper.  Refill albuterol MDI. ?Begin doxycycline.  ?Followup pulmonologist if not improving 10 days. ? ? ?Final Clinical Impressions(s) / UC Diagnoses  ? ?Final diagnoses:  ?Bronchitis with bronchospasm  ? ? ? ?Discharge Instructions   ? ?  ?Begin prednisone Wednesday 02/25/22. ?Take plain guaifenesin (1200mg  extended release tabs such as Mucinex) twice daily, with plenty of water, for cough and congestion.  Get adequate rest.   ?May take Delsym Cough Suppressant ("12 Hour Cough Relief") at bedtime for nighttime cough.  ?Try warm salt water gargles for sore throat.  ?  ? ? ? ?ED Prescriptions   ? ? Medication Sig Dispense Auth. Provider  ? doxycycline (VIBRAMYCIN) 100 MG capsule Take 1 capsule (100 mg total) by mouth 2 (two) times daily. 20 capsule Lattie HawBeese, Maraya Gwilliam A, MD  ? predniSONE (DELTASONE) 20 MG tablet Take one tab by mouth twice daily for 4 days, then one daily for 3 days. Take with food. 11 tablet Lattie HawBeese, Sevan Mcbroom A, MD  ? albuterol (VENTOLIN HFA) 108 (90 Base) MCG/ACT inhaler Inhale 2 puffs into the lungs every 6 (six) hours as needed for wheezing or shortness of breath. 8 g Lattie HawBeese, Ronnell Makarewicz A, MD  ? ?  ? ? ?  ?Lattie HawBeese, Ossie Beltran A, MD ?02/26/22 [phone] ? ?

## 2023-02-15 ENCOUNTER — Ambulatory Visit
Admission: EM | Admit: 2023-02-15 | Discharge: 2023-02-15 | Disposition: A | Discharge status: Home / Self Care | Payer: Self-pay | Attending: Family Medicine | Admitting: Family Medicine

## 2023-02-15 DIAGNOSIS — R059 Cough, unspecified: Secondary | ICD-10-CM

## 2023-02-15 DIAGNOSIS — R0989 Other specified symptoms and signs involving the circulatory and respiratory systems: Secondary | ICD-10-CM

## 2023-02-15 MED ORDER — METHYLPREDNISOLONE SODIUM SUCC 125 MG IJ SOLR
125.0000 mg | Freq: Once | INTRAMUSCULAR | Status: AC
Start: 2023-02-15 — End: 2023-02-15
  Administered 2023-02-15: 125 mg via INTRAMUSCULAR

## 2023-02-15 MED ORDER — AZITHROMYCIN 250 MG PO TABS: 250.0000 mg | ORAL_TABLET | Freq: Every day | ORAL | 0 refills | Status: DC

## 2023-02-15 MED ORDER — BENZONATATE 200 MG PO CAPS: 200.0000 mg | ORAL_CAPSULE | Freq: Three times a day (TID) | ORAL | 0 refills | Status: AC | PRN

## 2023-02-15 NOTE — ED Provider Notes (Signed)
[facility]    CSN: [id number] Arrival date & time: 02/15/23  1538      History   Chief Complaint Chief Complaint  Patient presents with   Shortness of Breath   chest congestion    Cough    HPI Victoria Russo is a 47 y.o. female.   HPI 58-YEAR-OLD FEMALE PRESENTS WITH COUGH, CHEST CONGESTION, CHEST TIGHTNESS AND SHORTNESS OF BREATH FOR 1 WEEK.  PMH significant for obesity, bronchitis and gestational diabetes.  Spanish translator Diego ID # [id number] will be assisting with translation today.  Past Medical History:  Diagnosis Date   Bronchitis    Gestational diabetes    UTI (urinary tract infection)     Patient Active Problem List   Diagnosis Date Noted   Obesity (BMI 30-39.9) 10/07/2017   Obesity in pregnancy 10/07/2017   Language barrier 10/07/2017    Past Surgical History:  Procedure Laterality Date   CESAREAN SECTION     TUBAL LIGATION      OB History     Gravida  4   Para  3   Term  1   Preterm      AB      Living  3      SAB      IAB      Ectopic      Multiple      Live Births  3            Home Medications    Prior to Admission medications   Medication Sig Start Date End Date Taking? Authorizing Provider  azithromycin (ZITHROMAX) 250 MG tablet Take 1 tablet (250 mg total) by mouth daily. Take first 2 tablets together, then 1 every day until finished. 02/15/23  Yes Trevor Iha, FNP  benzonatate (TESSALON) 200 MG capsule Take 1 capsule (200 mg total) by mouth 3 (three) times daily as needed for up to 7 days. 02/15/23 02/22/23 Yes Trevor Iha, FNP  albuterol (VENTOLIN HFA) 108 (90 Base) MCG/ACT inhaler Inhale 2 puffs into the lungs every 6 (six) hours as needed for wheezing or shortness of breath. 02/24/22   Lattie Haw, MD  beclomethasone (QVAR REDIHALER) 40 MCG/ACT inhaler Inhale 2 puffs into the lungs every 12 hours 02/21/21   Lattie Haw, MD  dextromethorphan-guaiFENesin (MUCINEX DM) 30-600 MG  12hr tablet Take 1 tablet by mouth 2 (two) times daily.    [provider]  doxycycline (VIBRAMYCIN) 100 MG capsule Take 1 capsule (100 mg total) by mouth 2 (two) times daily. 02/24/22   Lattie Haw, MD  guaiFENesin (ROBITUSSIN) 100 MG/5ML liquid Take 5 mLs by mouth every 4 (four) hours as needed for cough or to loosen phlegm.    [provider]  ibuprofen (ADVIL,MOTRIN) 200 MG tablet Take 200 mg by mouth every 8 (eight) hours as needed.    [provider]  omeprazole (PRILOSEC) 40 MG capsule Take one cap PO once daily, 30 minutes before evening meal 02/21/21   Lattie Haw, MD  Peppermint Oil (IBGARD PO) Take by mouth.    [provider]  predniSONE (DELTASONE) 20 MG tablet Take one tab by mouth twice daily for 4 days, then one daily for 3 days. Take with food. 02/24/22   Lattie Haw, MD    Family History Family History  Problem Relation Age of Onset   Cancer Mother 29       endometrial   Diabetes Maternal Aunt     Social History  Social History   Tobacco Use   Smoking status: Never   Smokeless tobacco: Never  Vaping Use   Vaping Use: Never used  Substance Use Topics   Alcohol use: Yes    Comment: occasionally   Drug use: No     Allergies   Patient has no known allergies.   Review of Systems Review of Systems  Respiratory:  Positive for cough.   All other systems reviewed and are negative.    Physical Exam Triage Vital Signs ED Triage Vitals  Enc Vitals Group     BP      Pulse      Resp      Temp      Temp src      SpO2      Weight      Height      Head Circumference      Peak Flow      Pain Score      Pain Loc      Pain Edu?      Excl. in GC?    No data found.  Updated Vital Signs BP 125/74   Pulse 66   Temp 98.2 F (36.8 C) (Oral)   Resp 19   LMP 02/01/2023 (Exact Date)   SpO2 98%    Physical Exam Vitals and nursing note reviewed.  Constitutional:      Appearance: Normal appearance. She is  obese.  HENT:     Head: Normocephalic and atraumatic.     Right Ear: Tympanic membrane, ear canal and external ear normal.     Left Ear: Tympanic membrane, ear canal and external ear normal.     Mouth/Throat:     Mouth: Mucous membranes are moist.     Pharynx: Oropharynx is clear.  Eyes:     Extraocular Movements: Extraocular movements intact.     Conjunctiva/sclera: Conjunctivae normal.     Pupils: Pupils are equal, round, and reactive to light.  Cardiovascular:     Rate and Rhythm: Normal rate and regular rhythm.     Pulses: Normal pulses.     Heart sounds: Normal heart sounds.  Pulmonary:     Effort: Pulmonary effort is normal.     Breath sounds: Normal breath sounds. No wheezing or rhonchi.     Comments: Infrequent nonproductive cough on exam Musculoskeletal:        General: Normal range of motion.     Cervical back: Normal range of motion and neck supple.  Skin:    General: Skin is warm and dry.  Neurological:     General: No focal deficit present.     Mental Status: She is alert and oriented to person, place, and time. Mental status is at baseline.      UC Treatments / Results  Labs (all labs ordered are listed, but only abnormal results are displayed) Labs Reviewed - No data to display  EKG   Radiology No results found.  Procedures Procedures (including critical care time)  Medications Ordered in UC Medications  methylPREDNISolone sodium succinate (SOLU-MEDROL) 125 mg/2 mL injection 125 mg (125 mg Intramuscular Given 02/15/23 1619)    Initial Impression / Assessment and Plan / UC Course  I have reviewed the triage vital signs and the nursing notes.  Pertinent labs & imaging results that were available during my care of the patient were reviewed by me and considered in my medical decision making (see chart for details).     MDM: 1.  Cough,  unspecified type-Rx'd Zithromax 500 mg day 1, then 250 mg daily x 4 days, Tessalon 200 mg 3 times daily, as needed;  2.  Chest congestion-IM Solu-Medrol 125 mg given once in clinic and prior to discharge. Instructed patient to take medications as directed with food to completion.  Advised may take Tessalon daily or as needed for cough.  Encouraged increase daily water intake while taking these medications.  Advised if symptoms worsen and/or unresolved please follow-up with PCP or here for further evaluation.  Work note provided to patient prior to discharge. Patient discharged home, hemodynamically stable. Final Clinical Impressions(s) / UC Diagnoses   Final diagnoses:  Cough, unspecified type  Chest congestion     Discharge Instructions      Instructed patient to take medications as directed with food to completion.  Advised may take Tessalon daily or as needed for cough.  Encouraged increase daily water intake while taking these medications.  Advised if symptoms worsen and/or unresolved please follow-up with PCP or here for further evaluation.     ED Prescriptions     Medication Sig Dispense Auth. Provider   azithromycin (ZITHROMAX) 250 MG tablet Take 1 tablet (250 mg total) by mouth daily. Take first 2 tablets together, then 1 every day until finished. 6 tablet Trevor Iha, FNP   benzonatate (TESSALON) 200 MG capsule Take 1 capsule (200 mg total) by mouth 3 (three) times daily as needed for up to 7 days. 40 capsule Trevor Iha, FNP      PDMP not reviewed this encounter.   Trevor Iha, FNP 02/15/23 1640

## 2023-02-15 NOTE — ED Triage Notes (Signed)
Pt presents to uc with co of cough, congestion in chest and sob for one week. Pt reports she has been taking radish syrup for symptoms.

## 2023-02-15 NOTE — Discharge Instructions (Addendum)
Instructed patient to take medications as directed with food to completion.  Advised may take Tessalon daily or as needed for cough.  Encouraged increase daily water intake while taking these medications.  Advised if symptoms worsen and/or unresolved please follow-up with PCP or here for further evaluation.

## 2023-07-13 DIAGNOSIS — E781 Pure hyperglyceridemia: Secondary | ICD-10-CM | POA: Insufficient documentation

## 2023-10-29 ENCOUNTER — Ambulatory Visit (INDEPENDENT_AMBULATORY_CARE_PROVIDER_SITE_OTHER): Payer: Self-pay

## 2023-10-29 ENCOUNTER — Ambulatory Visit
Admission: EM | Admit: 2023-10-29 | Discharge: 2023-10-29 | Disposition: A | Discharge status: Home / Self Care | Payer: Self-pay | Attending: Family Medicine | Admitting: Family Medicine

## 2023-10-29 DIAGNOSIS — R059 Cough, unspecified: Secondary | ICD-10-CM

## 2023-10-29 DIAGNOSIS — R0602 Shortness of breath: Secondary | ICD-10-CM

## 2023-10-29 DIAGNOSIS — R911 Solitary pulmonary nodule: Secondary | ICD-10-CM

## 2023-10-29 DIAGNOSIS — R062 Wheezing: Secondary | ICD-10-CM

## 2023-10-29 MED ORDER — METHYLPREDNISOLONE SODIUM SUCC 125 MG IJ SOLR
125.0000 mg | Freq: Once | INTRAMUSCULAR | Status: AC
  Administered 2023-10-29: 125 mg via INTRAMUSCULAR

## 2023-10-29 MED ORDER — IPRATROPIUM-ALBUTEROL 0.5-2.5 (3) MG/3ML IN SOLN
3.0000 mL | RESPIRATORY_TRACT | Status: AC
  Administered 2023-10-29: 3 mL via RESPIRATORY_TRACT

## 2023-10-29 MED ORDER — HYDROCODONE BIT-HOMATROP MBR 5-1.5 MG/5ML PO SOLN: 5.0000 mL | Freq: Four times a day (QID) | ORAL | 0 refills | Status: DC | PRN

## 2023-10-29 MED ORDER — PREDNISONE 10 MG (21) PO TBPK
ORAL_TABLET | Freq: Every day | ORAL | 0 refills | Status: DC
Start: 2023-10-29 — End: 2023-11-23

## 2023-10-29 MED ORDER — BENZONATATE 200 MG PO CAPS: 200.0000 mg | ORAL_CAPSULE | Freq: Three times a day (TID) | ORAL | 0 refills | Status: AC | PRN

## 2023-10-29 MED ORDER — DOXYCYCLINE HYCLATE 100 MG PO CAPS: 100.0000 mg | ORAL_CAPSULE | Freq: Two times a day (BID) | ORAL | 0 refills | Status: AC

## 2023-10-29 NOTE — ED Provider Notes (Signed)
[facility]    CSN: [id number] Arrival date & time: 10/29/23  3086      History   Chief Complaint Chief Complaint  Patient presents with   Cough   Nasal Congestion   Wheezing    HPI Victoria Russo is a 47 y.o. female.   HPI 47 year old female presents with cough, congestion and wheezing for 3 days.  Reports worsening in the last night.  PMH significant for obesity and gestational diabetes.  Past Medical History:  Diagnosis Date   Bronchitis    Gestational diabetes    UTI (urinary tract infection)     Patient Active Problem List   Diagnosis Date Noted   Obesity (BMI 30-39.9) 10/07/2017   Obesity in pregnancy 10/07/2017   Language barrier 10/07/2017    Past Surgical History:  Procedure Laterality Date   CESAREAN SECTION     TUBAL LIGATION      OB History     Gravida  4   Para  3   Term  1   Preterm      AB      Living  3      SAB      IAB      Ectopic      Multiple      Live Births  3            Home Medications    Prior to Admission medications   Medication Sig Start Date End Date Taking? Authorizing Provider  benzonatate (TESSALON) 200 MG capsule Take 1 capsule (200 mg total) by mouth 3 (three) times daily as needed for up to 7 days. 10/29/23 11/05/23 Yes Victoria Iha, FNP  doxycycline (VIBRAMYCIN) 100 MG capsule Take 1 capsule (100 mg total) by mouth 2 (two) times daily for 7 days. 10/29/23 11/05/23 Yes Victoria Iha, FNP  HYDROcodone bit-homatropine (HYCODAN) 5-1.5 MG/5ML syrup Take 5 mLs by mouth every 6 (six) hours as needed for cough. 10/29/23  Yes Victoria Iha, FNP  predniSONE (STERAPRED UNI-PAK 21 TAB) 10 MG (21) TBPK tablet Take by mouth daily. Take 6 tabs by mouth daily  for 2 days, then 5 tabs for 2 days, then 4 tabs for 2 days, then 3 tabs for 2 days, 2 tabs for 2 days, then 1 tab by mouth daily for 2 days 10/29/23  Yes Victoria Iha, FNP  albuterol (VENTOLIN HFA) 108 (90 Base) MCG/ACT inhaler  Inhale 2 puffs into the lungs every 6 (six) hours as needed for wheezing or shortness of breath. 02/24/22   Lattie Haw, MD  beclomethasone (QVAR REDIHALER) 40 MCG/ACT inhaler Inhale 2 puffs into the lungs every 12 hours 02/21/21   Lattie Haw, MD  dextromethorphan-guaiFENesin (MUCINEX DM) 30-600 MG 12hr tablet Take 1 tablet by mouth 2 (two) times daily.    [provider]  guaiFENesin (ROBITUSSIN) 100 MG/5ML liquid Take 5 mLs by mouth every 4 (four) hours as needed for cough or to loosen phlegm.    [provider]  ibuprofen (ADVIL,MOTRIN) 200 MG tablet Take 200 mg by mouth every 8 (eight) hours as needed.    [provider]  omeprazole (PRILOSEC) 40 MG capsule Take one cap PO once daily, 30 minutes before evening meal 02/21/21   Lattie Haw, MD  Peppermint Oil (IBGARD PO) Take by mouth.    [provider]    Family History Family History  Problem Relation Age of Onset   Cancer Mother 39  endometrial   Diabetes Maternal Aunt     Social History Social History   Tobacco Use   Smoking status: Never   Smokeless tobacco: Never  Vaping Use   Vaping status: Never Used  Substance Use Topics   Alcohol use: Yes    Comment: occasionally   Drug use: No     Allergies   Patient has no known allergies.   Review of Systems Review of Systems  HENT:  Positive for congestion.   Respiratory:  Positive for cough, shortness of breath and wheezing.   All other systems reviewed and are negative.    Physical Exam Triage Vital Signs ED Triage Vitals  Encounter Vitals Group     BP 10/29/23 0843 (!) 142/85     Systolic BP Percentile --      Diastolic BP Percentile --      Pulse Rate 10/29/23 0843 91     Resp 10/29/23 0843 17     Temp 10/29/23 0843 98.7 F (37.1 C)     Temp Source 10/29/23 0843 Oral     SpO2 10/29/23 0843 95 %     Weight --      Height --      Head Circumference --      Peak Flow --      Pain Score 10/29/23 0844 0      Pain Loc --      Pain Education --      Exclude from Growth Chart --    No data found.  Updated Vital Signs BP (!) 142/85 (BP Location: Left Arm)   Pulse 91   Temp 98.7 F (37.1 C) (Oral)   Resp 17   SpO2 95%       Physical Exam Vitals and nursing note reviewed.  Constitutional:      Appearance: Normal appearance. She is obese. She is ill-appearing.  HENT:     Head: Normocephalic and atraumatic.     Right Ear: Tympanic membrane, ear canal and external ear normal.     Left Ear: Tympanic membrane, ear canal and external ear normal.     Nose: Nose normal.     Mouth/Throat:     Mouth: Mucous membranes are moist.     Pharynx: Oropharynx is clear.  Eyes:     Extraocular Movements: Extraocular movements intact.     Conjunctiva/sclera: Conjunctivae normal.     Pupils: Pupils are equal, round, and reactive to light.  Cardiovascular:     Rate and Rhythm: Normal rate and regular rhythm.     Pulses: Normal pulses.     Heart sounds: Normal heart sounds. No murmur heard.    No friction rub. No gallop.  Pulmonary:     Effort: Pulmonary effort is normal.     Breath sounds: Wheezing and rhonchi present.     Comments: Initial auscultation: Diminished breath sounds noted throughout with wheezing and crackles noted over right middle/lower; post breathing treatment (DuoNeb): Improved air movement throughout, noted rhonchi, wheezing and crackles over right middle/lower Musculoskeletal:     Cervical back: Normal range of motion and neck supple.  Lymphadenopathy:     Cervical: Cervical adenopathy present.  Skin:    General: Skin is warm.  Neurological:     General: No focal deficit present.     Mental Status: She is alert and oriented to person, place, and time. Mental status is at baseline.      UC Treatments / Results  Labs (all labs ordered are listed, but  only abnormal results are displayed) Labs Reviewed - No data to display  EKG   Radiology DG Chest 2 View Result  Date: 10/29/2023 CLINICAL DATA:  Shortness of breath.  Cough and wheezing EXAM: CHEST - 2 VIEW COMPARISON:  None Available. FINDINGS: No consolidation, pneumothorax or effusion. No edema. Normal cardiopericardial silhouette. There is a right upper lobe lung nodule which is relatively dense and could be calcified but indeterminate. IMPRESSION: Right upper lung nodule. Uncertain etiology. Please correlate with any prior or dedicated CT scan when appropriate to confirm etiology. No consolidation or effusion Electronically Signed   By: Karen Kays M.D.   On: 10/29/2023 11:04    Procedures Procedures (including critical care time)  Medications Ordered in UC Medications  methylPREDNISolone sodium succinate (SOLU-MEDROL) 125 mg/2 mL injection 125 mg (125 mg Intramuscular Given 10/29/23 0916)  ipratropium-albuterol (DUONEB) 0.5-2.5 (3) MG/3ML nebulizer solution 3 mL (3 mLs Nebulization Given 10/29/23 0916)    Initial Impression / Assessment and Plan / UC Course  I have reviewed the triage vital signs and the nursing notes.  Pertinent labs & imaging results that were available during my care of the patient were reviewed by me and considered in my medical decision making (see chart for details).     MDM: 1.  Cough, unspecified type-CXR results revealed above, Rx'd doxycycline 100 mg capsule: Take 1 capsule twice daily x 7 days, Tessalon capsules 200 mg: Take 1 capsule daily x 3 days, as needed, Hycodan 5-1.5 mg / 5 mL syrup: Take 5 mL every 6 hours, as needed for cough; 2.  Wheezing-IM Solu-Medrol 125 mg given once in clinic and prior to discharge, DuoNeb nebulized inhaler given once in clinic and prior to discharge; 3.  Shortness of breath-Rx'd Sterapred Unipak (tapering from 60 mg to 10 mg over 10 days); 4.  Nodule of right lung-patient advised with copy of x-ray results provided to patient and husband prior to discharge today. Advised patient/husband of chest x-ray results and need for further  evaluation of right lung nodule with CT of the chest.  [facility] provider contact information provided with AVS today.  Patient advised to establish primary care now.  Advised patient to take medications as directed with food to completion.  Advised patient to take prednisone with first dose of doxycycline until complete.  Advised may use Tessalon capsules daily or as needed for cough.  Advised may use Hycodan cough syrup at night for cough prior to sleep due to sedate of effects.  Encouraged to increase daily water intake to 64 ounces per day while taking these medications.  Encouraged patient and husband to establish with [facility] primary care provider with contact information provided with this AVS today.  Patient discharged home, hemodynamically stable. Final Clinical Impressions(s) / UC Diagnoses   Final diagnoses:  Cough, unspecified type  Wheezing  Shortness of breath  Nodule of right lung     Discharge Instructions      Advised patient/husband of chest x-ray results and need for further evaluation of right lung nodule with CT of the chest.  [facility] provider contact information provided with AVS today.  Patient advised to establish primary care now.  Advised patient to take medications as directed with food to completion.  Advised patient to take prednisone with first dose of doxycycline until complete.  Advised may use Tessalon capsules daily or as needed for cough.  Advised may use Hycodan cough syrup at night for cough prior to sleep  due to sedate of effects.  Encouraged to increase daily water intake to 64 ounces per day while taking these medications.  Encouraged patient and husband to establish with [facility] primary care provider with contact information provided with this AVS today.     ED Prescriptions     Medication Sig Dispense Auth. Provider   doxycycline (VIBRAMYCIN) 100 MG capsule Take 1 capsule (100 mg total) by mouth 2 (two) times  daily for 7 days. 14 capsule Victoria Iha, FNP   predniSONE (STERAPRED UNI-PAK 21 TAB) 10 MG (21) TBPK tablet Take by mouth daily. Take 6 tabs by mouth daily  for 2 days, then 5 tabs for 2 days, then 4 tabs for 2 days, then 3 tabs for 2 days, 2 tabs for 2 days, then 1 tab by mouth daily for 2 days 42 tablet Victoria Iha, FNP   benzonatate (TESSALON) 200 MG capsule Take 1 capsule (200 mg total) by mouth 3 (three) times daily as needed for up to 7 days. 40 capsule Victoria Iha, FNP   HYDROcodone bit-homatropine (HYCODAN) 5-1.5 MG/5ML syrup Take 5 mLs by mouth every 6 (six) hours as needed for cough. 120 mL Victoria Iha, FNP      I have reviewed the PDMP during this encounter.   Victoria Iha, FNP 10/29/23 1130

## 2023-10-29 NOTE — ED Triage Notes (Signed)
Pt c/o cough, congestion and wheezing since Tuesday. Sxs worsening since last night. No known fever. Mucinex prn.

## 2023-10-29 NOTE — Discharge Instructions (Addendum)
Advised patient/husband of chest x-ray results and need for further evaluation of right lung nodule with CT of the chest.  [facility] provider contact information provided with AVS today.  Patient advised to establish primary care now.  Advised patient to take medications as directed with food to completion.  Advised patient to take prednisone with first dose of doxycycline until complete.  Advised may use Tessalon capsules daily or as needed for cough.  Advised may use Hycodan cough syrup at night for cough prior to sleep due to sedate of effects.  Encouraged to increase daily water intake to 64 ounces per day while taking these medications.  Encouraged patient and husband to establish with [facility] primary care provider with contact information provided with this AVS today.

## 2023-11-05 ENCOUNTER — Ambulatory Visit: Payer: Self-pay | Admitting: Urgent Care

## 2023-11-05 ENCOUNTER — Encounter: Payer: Self-pay | Admitting: Urgent Care

## 2023-11-05 VITALS — BP 123/76 | HR 64 | Ht 61.0 in | Wt 194.2 lb

## 2023-11-05 DIAGNOSIS — R911 Solitary pulmonary nodule: Secondary | ICD-10-CM | POA: Insufficient documentation

## 2023-11-05 DIAGNOSIS — R748 Abnormal levels of other serum enzymes: Secondary | ICD-10-CM | POA: Insufficient documentation

## 2023-11-05 DIAGNOSIS — Z8639 Personal history of other endocrine, nutritional and metabolic disease: Secondary | ICD-10-CM

## 2023-11-05 DIAGNOSIS — R221 Localized swelling, mass and lump, neck: Secondary | ICD-10-CM | POA: Insufficient documentation

## 2023-11-05 DIAGNOSIS — E781 Pure hyperglyceridemia: Secondary | ICD-10-CM

## 2023-11-05 NOTE — Patient Instructions (Addendum)
 Please call [facility] to schedule CT scan of the neck. [phone]  The CT scan of your chest 4 years ago did not have any area of concern other than a benign granuloma, therefore we do not need to repeat this as of right now.  We drew labs to assess your liver function.    Llame a [facility] para programar una tomografa computarizada del cuello. [phone]  La tomografa computarizada de su trax hace 4 aos no mostr ningn rea preocupante ms que un granuloma benigno, por lo tanto, no es necesario repetirla a partir de ahora.  Dibujamos laboratorios para evaluar su funcin heptica.

## 2023-11-05 NOTE — Progress Notes (Signed)
 New Patient Office Visit  Subjective:  Patient ID: Victoria Russo, female    DOB: 03-02-1976  Age: 48 y.o. MRN: [id number]  CC:  Chief Complaint  Patient presents with   Establish Care   Discussed the use of AI software for clinical note transcription with the patient who gave verbal consent to proceed. A Spanish language interpreter was used for this encounter.  HPI Victoria Russo presents to establish care. She did just follow with her former PCP on 11/01/23, but is hoping to transfer services here.  The patient, with a history of diabetes, presents with a chronic, intermittent sensation of throat closure, particularly under the tongue, which has been ongoing for approximately five years. This sensation is exacerbated by physical activities such as walking and dancing. The patient describes the sensation as a 'dry cough' that leads to a feeling of airway closure, and occasionally results in urinary incontinence due to the pressure from coughing. The patient also reports the production of a thick, sputum-like substance from under the tongue, which occasionally has a foul odor. She was seen in the UC on 10/29/23 and prescribed prednisone  (which she stopped taking due to jitteriness as a side effect), doxycycline  (which she states has helped with the foul odor under the tongue), and albuterol , which has stopped her wheezing.  Previously, the patient was diagnosed with asthma and bronchitis, but treatments for these conditions, including nebulizers and steroids, have not alleviated the tongue symptoms. The patient also reports a significant weight gain over the past six years, from 135 to 190 pounds, despite efforts to lose weight.  In addition to these symptoms, the patient has experienced episodes of tongue and throat swelling, which have been severe enough to warrant emergency room visits. This has been ongoing for years, and workups to date have not revealed a  cause.  The patient has undergone various imaging studies, including chest X-rays and a CT scan four years ago, which revealed a right upper lung nodule. The 5mm nodule (granuloma) was deemed benign by CT Scan in 2021 and required no follow-up. The patient also underwent an endoscopy four years ago, which revealed a stricture that was subsequently opened. Last colonoscopy was 2023, normal.  The patient's current medication regimen included Trulicity for prediabetes (last A1C 4 days ago 5.6%), atorvastatin, and protonix. The patient has stopped taking all of her chronic meds due to concerns of her ongoing throat symptoms and felt it may have been a side effect.  Outpatient Encounter Medications as of 11/05/2023  Medication Sig   [EXPIRED] benzonatate  (TESSALON ) 200 MG capsule Take 1 capsule (200 mg total) by mouth 3 (three) times daily as needed for up to 7 days.   dextromethorphan-guaiFENesin (MUCINEX DM) 30-600 MG 12hr tablet Take 1 tablet by mouth 2 (two) times daily.   [EXPIRED] doxycycline  (VIBRAMYCIN ) 100 MG capsule Take 1 capsule (100 mg total) by mouth 2 (two) times daily for 7 days.   ibuprofen (ADVIL,MOTRIN) 200 MG tablet Take 200 mg by mouth every 8 (eight) hours as needed.   Peppermint Oil (IBGARD PO) Take by mouth.   predniSONE  (STERAPRED UNI-PAK 21 TAB) 10 MG (21) TBPK tablet Take by mouth daily. Take 6 tabs by mouth daily  for 2 days, then 5 tabs for 2 days, then 4 tabs for 2 days, then 3 tabs for 2 days, 2 tabs for 2 days, then 1 tab by mouth daily for 2 days   atorvastatin (LIPITOR) 20 MG tablet Take by mouth. (Patient not taking:  Reported on 11/05/2023)   omeprazole  (PRILOSEC) 40 MG capsule Take one cap PO once daily, 30 minutes before evening meal (Patient not taking: Reported on 11/05/2023)   pantoprazole (PROTONIX) 40 MG tablet Take by mouth. (Patient not taking: Reported on 11/05/2023)   [DISCONTINUED] albuterol  (VENTOLIN  HFA) 108 (90 Base) MCG/ACT inhaler Inhale 2 puffs into the lungs  every 6 (six) hours as needed for wheezing or shortness of breath. (Patient not taking: Reported on 11/05/2023)   [DISCONTINUED] beclomethasone (QVAR  REDIHALER) 40 MCG/ACT inhaler Inhale 2 puffs into the lungs every 12 hours (Patient not taking: Reported on 11/05/2023)   [DISCONTINUED] guaiFENesin (ROBITUSSIN) 100 MG/5ML liquid Take 5 mLs by mouth every 4 (four) hours as needed for cough or to loosen phlegm. (Patient not taking: Reported on 11/05/2023)   [DISCONTINUED] HYDROcodone  bit-homatropine (HYCODAN) 5-1.5 MG/5ML syrup Take 5 mLs by mouth every 6 (six) hours as needed for cough. (Patient not taking: Reported on 11/05/2023)   [DISCONTINUED] TRULICITY 1.5 MG/0.5ML SOAJ INJECT 1 PEN INTO THE SKIN ONCE A WEEK (Patient not taking: Reported on 11/05/2023)   No facility-administered encounter medications on file as of 11/05/2023.    Past Medical History:  Diagnosis Date   Bronchitis    Gestational diabetes    UTI (urinary tract infection)     Past Surgical History:  Procedure Laterality Date   CESAREAN SECTION     TUBAL LIGATION      Family History  Problem Relation Age of Onset   Cancer Mother 64       endometrial   Diabetes Maternal Aunt     Social History   Socioeconomic History   Marital status: Married    Spouse name: Not on file   Number of children: Not on file   Years of education: Not on file   Highest education level: Not on file  Occupational History   Not on file  Tobacco Use   Smoking status: Never   Smokeless tobacco: Never  Vaping Use   Vaping status: Never Used  Substance and Sexual Activity   Alcohol use: Yes    Comment: occasionally   Drug use: No   Sexual activity: Not on file  Other Topics Concern   Not on file  Social History Narrative   Not on file   Social Drivers of Health   Financial Resource Strain: Not at Risk (04/01/2023)   Received from [employer]    Financial Resource Strain: 1  Food Insecurity: Not at Risk (04/01/2023)    Received from [facility] Insecurity    Food: 1  Transportation Needs: Not at Risk (04/01/2023)   Received from [employer] Needs    Transportation: 1  Physical Activity: At Risk (04/01/2023)   Received from [facility]   Physical Activity    Physical Activity: 2  Stress: Not at Risk (04/01/2023)   Received from [facility]   Stress    Stress: 1  Social Connections: Not at Risk (04/01/2023)   Received from [employer]   Social Connections    Connectedness: 1  Intimate Partner Violence: Not on file    ROS: as noted in HPI  Objective:  BP 123/76 (BP Location: Left Arm, Patient Position: Sitting, Cuff Size: Normal)   Pulse 64   Ht 5' 1 (1.549 m)   Wt 194 lb 3.2 oz (88.1 kg)   LMP 11/03/2023 (Exact Date)   SpO2 98%   BMI 36.69 kg/m   Physical Exam Vitals and nursing  note reviewed. Exam conducted with a chaperone present (interpreter).  Constitutional:      General: She is not in acute distress.    Appearance: Normal appearance. She is obese. She is not ill-appearing, toxic-appearing or diaphoretic.  HENT:     Head: Normocephalic and atraumatic.     Jaw: There is normal jaw occlusion. No trismus or swelling.     Salivary Glands: Right salivary gland is not diffusely enlarged or tender. Left salivary gland is not diffusely enlarged or tender.      Right Ear: Tympanic membrane, ear canal and external ear normal. There is no impacted cerumen. Tympanic membrane is not injected, perforated or erythematous.     Left Ear: Tympanic membrane, ear canal and external ear normal. There is no impacted cerumen. Tympanic membrane is not injected, perforated or erythematous.     Nose: Nose normal. No congestion or rhinorrhea.     Right Nostril: No occlusion.     Left Nostril: No occlusion.     Right Turbinates: Not enlarged or swollen.     Left Turbinates: Not enlarged or swollen.     Right Sinus: No maxillary sinus tenderness or frontal sinus tenderness.     Left Sinus: No maxillary sinus  tenderness or frontal sinus tenderness.     Mouth/Throat:     Lips: Pink. No lesions.     Mouth: Mucous membranes are moist. No oral lesions.     Tongue: No lesions. Tongue does not deviate from midline.     Palate: No mass and lesions.     Pharynx: Oropharynx is clear. Uvula midline. No pharyngeal swelling, oropharyngeal exudate, posterior oropharyngeal erythema or uvula swelling.  Cardiovascular:     Rate and Rhythm: Normal rate and regular rhythm.     Pulses: Normal pulses.  Pulmonary:     Effort: Pulmonary effort is normal. No respiratory distress.     Breath sounds: Normal breath sounds. No stridor. No wheezing, rhonchi or rales.  Chest:     Chest wall: No tenderness.  Abdominal:     General: Abdomen is flat. Bowel sounds are normal. There is no distension.     Palpations: Abdomen is soft. There is no mass.     Tenderness: There is no right CVA tenderness, left CVA tenderness, guarding or rebound.  Musculoskeletal:     Cervical back: Normal range of motion and neck supple. No rigidity or tenderness.  Lymphadenopathy:     Cervical: No cervical adenopathy.  Skin:    General: Skin is warm and dry.     Coloration: Skin is not jaundiced.     Findings: No erythema or rash.  Neurological:     General: No focal deficit present.     Mental Status: She is alert and oriented to person, place, and time.     Cranial Nerves: No cranial nerve deficit.     Sensory: No sensory deficit.     Motor: No weakness.     Gait: Gait normal.  Psychiatric:        Mood and Affect: Mood normal.        Behavior: Behavior normal.     Last CBC Lab Results  Component Value Date   HGB 13.3 07/19/2017   HCT 38 07/19/2017   PLT 302 07/19/2017   Last metabolic panel Lab Results  Component Value Date   GLUCOSE 145 (H) 08/25/2017   NA 139 08/25/2017   K 4.0 08/25/2017   CL 102 08/25/2017   CO2 22 08/25/2017  BUN 8 08/25/2017   CREATININE 0.54 (L) 08/25/2017   GFRNONAA 119 08/25/2017   CALCIUM  9.5 08/25/2017   PROT 6.4 11/05/2023   ALBUMIN 3.7 08/25/2017   LABGLOB 2.6 08/25/2017   AGRATIO 1.4 08/25/2017   BILITOT 0.3 11/05/2023   ALKPHOS 89 08/25/2017   AST 13 11/05/2023   ALT 15 11/05/2023   Last lipids No results found for: CHOL, HDL, LDLCALC, LDLDIRECT, TRIG, CHOLHDL Last hemoglobin A1c Lab Results  Component Value Date   HGBA1C 5.3 08/25/2017   Last thyroid functions Lab Results  Component Value Date   TSH 0.430 (L) 08/25/2017   Last vitamin D No results found for: 25OHVITD2, 25OHVITD3, VD25OH Last vitamin B12 and Folate No results found for: VITAMINB12, FOLATE    Assessment & Plan:  Nodule of upper lobe of right lung Assessment & Plan: It was recommended on recent CXR to obtain CT imaging of chest, however record review dating back to 2021 shows CT chest at that time showing a 5mm granuloma to the RUL, stable and benign, with no further workup or monitoring indicated. Pt is interested in repeating CT scan chest at some point to make sure there is no growth, however does not currently have insurance and thus is wanting to wait until her new plan is active.   Alkaline phosphatase elevation -     Gamma GT -     Hepatic function panel  Submental mass Assessment & Plan: Ddx includes: reactive lymphadenitis (swollen lymph nodes due to infection), epidermoid cyst, dermoid cyst, lipoma, plunging ranula (from the sublingual gland), salivary gland pathology, abscess, benign or malignant tumors, and in rare cases, a branchial cleft cyst or thyroglossal duct cyst. -CT scan soft tissue neck to further assess causes -continue doxycycline  as this appears to be helping  Orders: -     CT SOFT TISSUE NECK W CONTRAST; Future  History of diabetes mellitus Assessment & Plan: Recent A1C on 11/01/23 was 5.6%.  Pt is no longer taking Trulicity which I feel is appropriate. Monitor   Hypertriglyceridemia Assessment & Plan: LDL 120, TG 176 in may  2024. Pt stopped her atorvastatin recently due to concerns this was related to her tongue swelling. Will recheck fasting labs in near future to determine need to continue. Highly suspect tongue issue is unrelated to statin.     Return in about 3 months (around 02/03/2024).   Benton LITTIE Gave, PA

## 2023-11-06 DIAGNOSIS — Z8639 Personal history of other endocrine, nutritional and metabolic disease: Secondary | ICD-10-CM | POA: Insufficient documentation

## 2023-11-06 LAB — HEPATIC FUNCTION PANEL
AG Ratio: 1.5 (calc) (ref 1.0–2.5)
ALT: 15 U/L (ref 6–29)
AST: 13 U/L (ref 10–35)
Albumin: 3.8 g/dL (ref 3.6–5.1)
Alkaline phosphatase (APISO): 123 U/L (ref 31–125)
Bilirubin, Direct: 0.1 mg/dL (ref 0.0–0.2)
Globulin: 2.6 g/dL (ref 1.9–3.7)
Indirect Bilirubin: 0.2 mg/dL (ref 0.2–1.2)
Total Bilirubin: 0.3 mg/dL (ref 0.2–1.2)
Total Protein: 6.4 g/dL (ref 6.1–8.1)

## 2023-11-06 LAB — GAMMA GT: GGT: 16 U/L (ref 3–55)

## 2023-11-06 NOTE — Assessment & Plan Note (Signed)
 Ddx includes: reactive lymphadenitis (swollen lymph nodes due to infection), epidermoid cyst, dermoid cyst, lipoma, plunging ranula (from the sublingual gland), salivary gland pathology, abscess, benign or malignant tumors, and in rare cases, a branchial cleft cyst or thyroglossal duct cyst. -CT scan soft tissue neck to further assess causes -continue doxycycline  as this appears to be helping

## 2023-11-06 NOTE — Assessment & Plan Note (Signed)
 LDL 120, TG 176 in may 2024. Pt stopped her atorvastatin recently due to concerns this was related to her tongue swelling. Will recheck fasting labs in near future to determine need to continue. Highly suspect tongue issue is unrelated to statin.

## 2023-11-06 NOTE — Assessment & Plan Note (Signed)
 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 Pt is interested in repeating CT scan chest at some point to make sure there is no growth, however does not currently have insurance and thus is wanting to wait until her new plan is active.

## 2023-11-06 NOTE — Assessment & Plan Note (Signed)
 Recent A1C on 11/01/23 was 5.6%.  Pt is no longer taking Trulicity which I feel is appropriate. Monitor

## 2023-11-06 NOTE — Progress Notes (Signed)
 Normal. Resultos de higado son normales. No necessitamos hacer mas pruebas.

## 2023-11-09 ENCOUNTER — Ambulatory Visit (INDEPENDENT_AMBULATORY_CARE_PROVIDER_SITE_OTHER): Payer: Self-pay

## 2023-11-09 DIAGNOSIS — R221 Localized swelling, mass and lump, neck: Secondary | ICD-10-CM

## 2023-11-09 MED ORDER — IOHEXOL 300 MG/ML  SOLN
100.0000 mL | Freq: Once | INTRAMUSCULAR | Status: AC | PRN
  Administered 2023-11-09: 75 mL via INTRAVENOUS

## 2023-11-12 ENCOUNTER — Other Ambulatory Visit: Payer: Self-pay

## 2023-11-23 ENCOUNTER — Ambulatory Visit (INDEPENDENT_AMBULATORY_CARE_PROVIDER_SITE_OTHER): Payer: Self-pay | Admitting: Urgent Care

## 2023-11-23 ENCOUNTER — Encounter: Payer: Self-pay | Admitting: Urgent Care

## 2023-11-23 VITALS — BP 122/74 | HR 78 | Wt 193.0 lb

## 2023-11-23 DIAGNOSIS — K119 Disease of salivary gland, unspecified: Secondary | ICD-10-CM

## 2023-11-23 DIAGNOSIS — R61 Generalized hyperhidrosis: Secondary | ICD-10-CM | POA: Insufficient documentation

## 2023-11-23 DIAGNOSIS — R911 Solitary pulmonary nodule: Secondary | ICD-10-CM

## 2023-11-23 DIAGNOSIS — R0683 Snoring: Secondary | ICD-10-CM

## 2023-11-23 DIAGNOSIS — R140 Abdominal distension (gaseous): Secondary | ICD-10-CM

## 2023-11-23 DIAGNOSIS — E782 Mixed hyperlipidemia: Secondary | ICD-10-CM

## 2023-11-23 MED ORDER — AMOXICILLIN-POT CLAVULANATE 875-125 MG PO TABS: 1.0000 | ORAL_TABLET | Freq: Two times a day (BID) | ORAL | 0 refills | Status: AC

## 2023-11-23 NOTE — Progress Notes (Signed)
Established Patient Office Visit  Subjective:  Patient ID: Victoria Russo, female    DOB: [birth year]  Age: 48 y.o. MRN: [id number]  Chief Complaint  Patient presents with   Follow-up    Follow up on Ct.   Discussed the use of AI software for clinical note transcription with the patient who gave verbal consent to proceed. Additionally, a Adult nurse helped conduct the visit.  History of Present Illness The patient presents for a follow-up consultation regarding persistent abdominal discomfort, primarily in the right upper quadrant but occasionally in the lower pelvis. The discomfort is described as a sharp, stabbing pain, accompanied by a sensation of swelling and bloating. The patient has tried proton pump inhibitors without relief and denies any gastroesophageal reflux disease (GERD) symptoms. A recent CT scan did not reveal any abnormalities in the thyroid or other areas.  The patient also reports a sensation of excessive salivation, describing it as a feeling of accumulation in the submental salivary gland area. This symptom has not been previously evaluated by a specialist. The patient has not experienced any significant weight changes in the past six years. She reports a substance that comes from under her tongue. Recent CT Scan was WNL.  The patient has a history of high triglycerides, with a recent reading of 338. However, the patient's overall cardiac risk is calculated to be low at 1.3%. The patient just recently restarted her atorvastatin for cholesterol management.  The patient also reports experiencing night sweats for the past four to five months, which she attributes to possible perimenopause. The patient's last menstrual period was at the beginning of the month, and she has been maintaining the same weight for the past six years. The patient also mentions snoring loudly at night, which has been disruptive to her sleep.  In addition, the patient mentions a  previous finding of a mass in the right upper lobe of the lung from a chest x-ray done at an urgent care center. However, a CT scan from four years ago showed the same mass, which was reported as a benign 5mm granuloma with no required follow-up. The patient expresses a desire for further investigation of this finding, as well as her abdominal discomfort and salivary gland issue.     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 11/05/2023   Alkaline phosphatase elevation 11/05/2023   Submental mass 11/05/2023   Hypertriglyceridemia 07/13/2023   History of colon polyps 03/26/2021   Obesity (BMI 30-39.9) 10/07/2017   Obesity in pregnancy 10/07/2017   Language barrier 10/07/2017   Past Medical History:  Diagnosis Date   Bronchitis    Gestational diabetes    UTI (urinary tract infection)    Past Surgical History:  Procedure Laterality Date   CESAREAN SECTION     TUBAL LIGATION     Social History   Tobacco Use   Smoking status: Never   Smokeless tobacco: Never  Vaping Use   Vaping status: Never Used  Substance Use Topics   Alcohol use: Yes    Comment: occasionally   Drug use: No      ROS: as noted in HPI  Objective:     BP 122/74   Pulse 78   Wt 193 lb (87.5 kg)  LMP 11/03/2023 (Exact Date)   SpO2 98%   BMI 36.47 kg/m  BP Readings from Last 3 Encounters:  11/23/23 122/74  11/05/23 123/76  10/29/23 (!) 142/85   Wt Readings from Last 3 Encounters:  11/23/23 193 lb (87.5 kg)  11/05/23 194 lb 3.2 oz (88.1 kg)  02/24/22 186 lb (84.4 kg)      Physical Exam Vitals and nursing note reviewed. Exam conducted with a chaperone present.  Constitutional:      General: She is not in acute distress.    Appearance: Normal  appearance. She is obese. She is not ill-appearing, toxic-appearing or diaphoretic.  HENT:     Head: Normocephalic and atraumatic.     Nose: Nose normal. No congestion.     Mouth/Throat:     Mouth: Mucous membranes are moist.     Pharynx: Oropharynx is clear. No oropharyngeal exudate or posterior oropharyngeal erythema.  Eyes:     General: No scleral icterus.       Right eye: No discharge.        Left eye: No discharge.     Extraocular Movements: Extraocular movements intact.     Pupils: Pupils are equal, round, and reactive to light.  Cardiovascular:     Rate and Rhythm: Normal rate and regular rhythm.     Heart sounds: No murmur heard. Pulmonary:     Effort: Pulmonary effort is normal. No respiratory distress.     Breath sounds: Normal breath sounds. No stridor. No wheezing or rhonchi.  Abdominal:     General: Abdomen is flat. Bowel sounds are normal. There is no distension.     Palpations: Abdomen is soft. There is no mass.     Tenderness: There is abdominal tenderness (mild discomfort generalized with no point tenderness; no guarding, rigidity or signs of acute abdomen). There is no right CVA tenderness, left CVA tenderness, guarding or rebound.     Hernia: No hernia is present.  Musculoskeletal:     Cervical back: Normal range of motion and neck supple. No rigidity.  Lymphadenopathy:     Cervical: No cervical adenopathy.  Skin:    General: Skin is warm and dry.     Coloration: Skin is not jaundiced.     Findings: No erythema or rash.  Neurological:     General: No focal deficit present.     Mental Status: She is alert and oriented to person, place, and time.      No results found for any visits on 11/23/23.  Last CBC Lab Results  Component Value Date   HGB 13.3 07/19/2017   HCT 38 07/19/2017   PLT 302 07/19/2017   Last metabolic panel Lab Results  Component Value Date   GLUCOSE 145 (H) 08/25/2017   NA 139 08/25/2017   K 4.0 08/25/2017   CL 102 08/25/2017    CO2 22 08/25/2017   BUN 8 08/25/2017   CREATININE 0.54 (L) 08/25/2017   GFRNONAA 119 08/25/2017   CALCIUM 9.5 08/25/2017   PROT 6.4 11/05/2023   ALBUMIN 3.7 08/25/2017   LABGLOB 2.6 08/25/2017   AGRATIO 1.4 08/25/2017   BILITOT 0.3 11/05/2023   ALKPHOS 89 08/25/2017   AST 13 11/05/2023   ALT 15 11/05/2023   Last lipids No results found for: "CHOL", "HDL", "LDLCALC", "LDLDIRECT", "TRIG", "CHOLHDL" Last hemoglobin A1c Lab Results  Component Value Date   HGBA1C 5.3 08/25/2017      The 10-year ASCVD risk score (Arnett DK, et al., 2019) is: 1.3%  Assessment &  Plan:  Abdominal bloating Assessment & Plan: Abdominal Discomfort Right upper quadrant and lower pelvic discomfort with a sensation of swelling and sharp stabbing pain. No GERD symptoms.  -Order H. pylori breath test to rule out gastric bacterial infection. -pt requesting CT abdomen if test today is negative.  Orders: -     H. pylori breath test  Night sweats Assessment & Plan: Night Sweats New onset of night sweats, possibly related to perimenopause. -Recommend over-the-counter black cohosh for potential perimenopausal symptoms. -Consider CBC if symptoms do not improve.   Salivary gland disturbance Assessment & Plan: Recent CT showed no abnormalities. Patient describes symptoms consistent with submental salivary gland issues. -Consider Referral to ENT specialist for further evaluation of salivary gland, currently deferred due to lack of insurance -Start a 7-day course of antibiotics (augmentin) and a sialogog to stimulate salivary gland; monitor for improvement  Orders: -     Amoxicillin-Pot Clavulanate; Take 1 tablet by mouth 2 (two) times daily with a meal for 7 days.  Dispense: 14 tablet; Refill: 0  Snoring Assessment & Plan: Order placed for home sleep test the be performed through [facility]   Nodule of upper lobe of right lung -     CT CHEST WO CONTRAST; Future  Mixed  hyperlipidemia Assessment & Plan: Hyperlipidemia High triglycerides (338) but overall cardiac risk is low (1.3%). Patient had stopped taking atorvastatin prior to the test. -Resume atorvastatin and monitor.      No follow-ups on file.   Maretta Bees, PA

## 2023-11-23 NOTE — Assessment & Plan Note (Signed)
Hyperlipidemia High triglycerides (338) but overall cardiac risk is low (1.3%). Patient had stopped taking atorvastatin prior to the test. -Resume atorvastatin and monitor.

## 2023-11-23 NOTE — Assessment & Plan Note (Signed)
Night Sweats New onset of night sweats, possibly related to perimenopause. -Recommend over-the-counter black cohosh for potential perimenopausal symptoms. -Consider CBC if symptoms do not improve.

## 2023-11-23 NOTE — Assessment & Plan Note (Signed)
Recent CT showed no abnormalities. Patient describes symptoms consistent with submental salivary gland issues. -Consider Referral to ENT specialist for further evaluation of salivary gland, currently deferred due to lack of insurance -Start a 7-day course of antibiotics (augmentin) and a sialogog to stimulate salivary gland; monitor for improvement

## 2023-11-23 NOTE — Patient Instructions (Addendum)
We have ordered an H pylori test. This will determine if bacteria in your stomach is the cause of your symptoms. If positive, we will start treatment, if negative, we will order a CT of the abdomen.  Additionally, I would like to try a treatment for your salivary glands - please purchase War heads candy or lemon heads. Suck on these several times daily to try and stimulate your salivary gland. Additionally the antibiotic will cover for any bacterial cause of the salivary issue.  I have ordered a sleep test to be performed at your home. You will wear the device on your thumb for two consecutive nights. Once I receive the results back, we will discuss if you have sleep apnea and recommendations.  I have placed an order for a CT scan of the chest to monitor for stability of the suspected benign 5mm lesion in the R lung.  Regarding the night sweats, try over the counter Black Cohosh and Evening primrose oil. You can also try Vitamin E. These will help if the night sweats are related to hot flashes. If they continue, let me know and we will obtain additional blood tests.     Hemos ordenado una prueba de H. pylori. Esto determinar si las bacterias en su estmago son la causa de sus sntomas. Si es positivo iniciaremos tratamiento, si es negativo ordenaremos una tomografa computarizada de abdomen.  Adems, me gustara probar un tratamiento para sus glndulas salivales: compre caramelos War heads o cabezas de limn. Chpalos varias veces al da para intentar estimular tu glndula salival. Adems, el antibitico cubrir cualquier causa bacteriana del problema salival.  He ordenado que se realice una prueba de sueo en su casa. Llevar el dispositivo en el pulgar durante dos noches consecutivas. Una vez que reciba los [place], discutiremos si tiene apnea del sueo y le daremos recomendaciones.  He solicitado una tomografa computarizada del trax para controlar la estabilidad de la presunta lesin  benigna de 5 mm en el pulmn derecho.  En cuanto a los sudores nocturnos, pruebe el aceite de [place] y cohosh negro de Sales promotion account executive. Tambin puedes probar la vitamina E. Te ayudar si los sudores nocturnos estn relacionados con los sofocos. Si continan, avseme y obtendremos anlisis de [employer].

## 2023-11-23 NOTE — Assessment & Plan Note (Signed)
Abdominal Discomfort Right upper quadrant and lower pelvic discomfort with a sensation of swelling and sharp stabbing pain. No GERD symptoms.  -Order H. pylori breath test to rule out gastric bacterial infection. -pt requesting CT abdomen if test today is negative.

## 2023-11-23 NOTE — Assessment & Plan Note (Signed)
Order placed for home sleep test the be performed through [facility]

## 2023-11-24 ENCOUNTER — Other Ambulatory Visit: Payer: Self-pay | Admitting: Urgent Care

## 2023-11-24 DIAGNOSIS — R61 Generalized hyperhidrosis: Secondary | ICD-10-CM

## 2023-11-24 DIAGNOSIS — R140 Abdominal distension (gaseous): Secondary | ICD-10-CM

## 2023-11-24 DIAGNOSIS — R748 Abnormal levels of other serum enzymes: Secondary | ICD-10-CM

## 2023-11-24 LAB — H. PYLORI BREATH TEST: H. pylori Breath Test: NOT DETECTED

## 2023-11-24 NOTE — Progress Notes (Signed)
Called and spoke with patient regarding her negative H pylori results. 2 identifiers obtained. Pt aware that her test is negative and is requesting additional imaging (CT scan) of abdomen to further assess for her discomfort and bloating. Order placed. PT will get CT chest and abd/pelvis at same time; requested location [place] [facility]. All questions/ concerns addressed.

## 2023-11-25 ENCOUNTER — Ambulatory Visit: Payer: Self-pay

## 2023-11-25 DIAGNOSIS — R102 Pelvic and perineal pain: Secondary | ICD-10-CM

## 2023-11-25 DIAGNOSIS — R918 Other nonspecific abnormal finding of lung field: Secondary | ICD-10-CM

## 2023-11-25 DIAGNOSIS — R61 Generalized hyperhidrosis: Secondary | ICD-10-CM

## 2023-11-25 DIAGNOSIS — R1011 Right upper quadrant pain: Secondary | ICD-10-CM

## 2023-11-25 DIAGNOSIS — R748 Abnormal levels of other serum enzymes: Secondary | ICD-10-CM

## 2023-11-25 DIAGNOSIS — R140 Abdominal distension (gaseous): Secondary | ICD-10-CM

## 2023-11-25 DIAGNOSIS — R911 Solitary pulmonary nodule: Secondary | ICD-10-CM

## 2023-12-01 ENCOUNTER — Other Ambulatory Visit: Payer: Self-pay | Admitting: Urgent Care

## 2023-12-01 DIAGNOSIS — E669 Obesity, unspecified: Secondary | ICD-10-CM

## 2023-12-01 DIAGNOSIS — R160 Hepatomegaly, not elsewhere classified: Secondary | ICD-10-CM

## 2023-12-01 DIAGNOSIS — K760 Fatty (change of) liver, not elsewhere classified: Secondary | ICD-10-CM

## 2023-12-01 DIAGNOSIS — R140 Abdominal distension (gaseous): Secondary | ICD-10-CM

## 2023-12-01 DIAGNOSIS — K119 Disease of salivary gland, unspecified: Secondary | ICD-10-CM

## 2023-12-01 MED ORDER — TIRZEPATIDE-WEIGHT MANAGEMENT 2.5 MG/0.5ML ~~LOC~~ SOLN
2.5000 mg | SUBCUTANEOUS | 1 refills | Status: DC
Start: 2023-12-01 — End: 2024-06-28

## 2023-12-01 NOTE — Addendum Note (Signed)
Addended by: Guy Sandifer on: 12/01/2023 01:22 PM   Modules accepted: Orders

## 2023-12-01 NOTE — Progress Notes (Signed)
Tirzepatide called in in place of trulicity as A1C 5.6% and glucose reduction is not the goal. Will do trial of tirzepatide to help with weight loss. Referral placed to [facility] for further evaluation and management of abdominal issues, hepatomegaly and hepatic steatosis. Pt to call and schedule: [phone]  Additionally, will refer to ENT due to recurrent issues with her submental region. Referral placed to [facility] [phone]

## 2023-12-03 ENCOUNTER — Encounter: Payer: Self-pay | Admitting: Gastroenterology

## 2023-12-03 ENCOUNTER — Encounter (INDEPENDENT_AMBULATORY_CARE_PROVIDER_SITE_OTHER): Payer: Self-pay | Admitting: Otolaryngology

## 2023-12-07 NOTE — Progress Notes (Signed)
[name]- Your sleep study is normal. You do not have sleep apnea. Please let me know if you have any questions.  [name] -  Tu estudio del sueo fue normal. No tienes apnea del sueo. Por favor, hgamelo saber si tiene alguna pregunta.

## 2023-12-15 ENCOUNTER — Encounter: Payer: Self-pay | Admitting: Urgent Care

## 2024-01-13 ENCOUNTER — Ambulatory Visit (INDEPENDENT_AMBULATORY_CARE_PROVIDER_SITE_OTHER): Payer: Self-pay | Admitting: Gastroenterology

## 2024-01-13 ENCOUNTER — Other Ambulatory Visit (INDEPENDENT_AMBULATORY_CARE_PROVIDER_SITE_OTHER): Payer: Self-pay

## 2024-01-13 ENCOUNTER — Ambulatory Visit: Payer: Self-pay | Admitting: Gastroenterology

## 2024-01-13 ENCOUNTER — Encounter: Payer: Self-pay | Admitting: Gastroenterology

## 2024-01-13 VITALS — BP 124/74 | HR 72 | Ht 61.0 in | Wt 195.0 lb

## 2024-01-13 DIAGNOSIS — K760 Fatty (change of) liver, not elsewhere classified: Secondary | ICD-10-CM

## 2024-01-13 DIAGNOSIS — R1084 Generalized abdominal pain: Secondary | ICD-10-CM

## 2024-01-13 DIAGNOSIS — R160 Hepatomegaly, not elsewhere classified: Secondary | ICD-10-CM

## 2024-01-13 DIAGNOSIS — R194 Change in bowel habit: Secondary | ICD-10-CM

## 2024-01-13 DIAGNOSIS — R140 Abdominal distension (gaseous): Secondary | ICD-10-CM

## 2024-01-13 DIAGNOSIS — R1319 Other dysphagia: Secondary | ICD-10-CM

## 2024-01-13 DIAGNOSIS — R1310 Dysphagia, unspecified: Secondary | ICD-10-CM

## 2024-01-13 DIAGNOSIS — K9089 Other intestinal malabsorption: Secondary | ICD-10-CM

## 2024-01-13 LAB — IBC + FERRITIN
Ferritin: 27.4 ng/mL (ref 10.0–291.0)
Iron: 47 ug/dL (ref 42–145)
Saturation Ratios: 10.8 % — ABNORMAL LOW (ref 20.0–50.0)
TIBC: 436.8 ug/dL (ref 250.0–450.0)
Transferrin: 312 mg/dL (ref 212.0–360.0)

## 2024-01-13 LAB — HEPATIC FUNCTION PANEL
ALT: 15 U/L (ref 0–35)
AST: 15 U/L (ref 0–37)
Albumin: 4.1 g/dL (ref 3.5–5.2)
Alkaline Phosphatase: 116 U/L (ref 39–117)
Bilirubin, Direct: 0.1 mg/dL (ref 0.0–0.3)
Total Bilirubin: 0.3 mg/dL (ref 0.2–1.2)
Total Protein: 6.7 g/dL (ref 6.0–8.3)

## 2024-01-13 LAB — PROTIME-INR
INR: 1 ratio (ref 0.8–1.0)
Prothrombin Time: 10.9 s (ref 9.6–13.1)

## 2024-01-13 NOTE — Patient Instructions (Addendum)
  Comience con [place] 1 cucharadita oral al da. Se proporcionan muestras de [place]; tome 2 cpsulas con las comidas. Se recomienda no consumir alcohol. Se recomienda hacer ejercicio segn la tolerancia.  Se le ha programado una ecografa abdominal en [facility] el 20/3/25 a las 10:00 a. m. Por favor, llegue 30 minutos antes de su cita para registrarse. Asegrese de no comer ni beber nada 6 horas antes de su cita. Si necesita reprogramar su cita, comunquese con radiologa al [phone]. Esta prueba suele durar unos 30 minutos.  Le hemos proporcionado muestras de los siguientes medicamentos para que los tome: Ibgard: 2 cpsulas con las comidas.  _______________________________________________________  Neita Garnet su presin arterial en su consulta era de 140/90 o superior, comunquese con su mdico de cabecera para darle seguimiento.  _______________________________________________________  Si tiene 65 aos o ms, su ndice de Engineer, maintenance (IT) (IMC) debe estar entre 23 y 30. Su IMC es de 36,84 kg/m. Si este valor est fuera del rango mencionado, considere consultar con su mdico de cabecera.  Si tiene [address] [place], su [facility] debe estar entre 19 y 25. Su IMC es de 36,84 kg/m. Si este valor est fuera del rango mencionado, considere Science writer con su mdico de Information systems manager.  ____________________________________________________________  [employer] de [facility] le animan a utilizar MYCHART para comunicarse con otros profesionales para solicitudes o preguntas que no sean urgentes. Debido a los Retail buyer de espera por telfono, Corporate treasurer un mensaje a su profesional por [employer] puede ser una forma ms rpida y eficiente de Designer, industrial/product. El plazo de [place] es de 48 horas hbiles. Recuerde que esto es para solicitudes que no son urgentes. _______________________________________________________  Marita Snellen su atencin  gastrointestinal!  Margarite Gouge May, NP

## 2024-01-13 NOTE — Progress Notes (Signed)
 Chief Complaint: abdominal bloating, hepatomegaly, hepatic steatosis Primary GI Doctor: Dr. Leonides Schanz  HPI:  Patient is a  48  year old female patient with past medical history of high cholesterol, gestational diabetes, obesity, who was referred to me by Maretta Bees, PA on 12/01/23 for a complaint of abdominal bloating, hepatomegaly, hepatic steatosis .    11/23/23 patient seen in the office by PCP with main complaint of persistent abdominal pain, primarily in the right upper quadrant.  The discomfort is described as a sharp stabbing pain accompanied by sensation of swelling and bloating.  The patient has tried protein pump inhibitors without relief.  HPI breath test ordered which was negative.  Interval History     Due to language barrier, an interpreter was present during the history-taking and subsequent discussion (and for part of the physical exam) with this patient.   During this his examination it was difficult to get a clear history from the patient.  Patient does present with several gastrointestinal complaints.  Her first complaint is of esophageal dysphagia with solids.  She states she has had this issue for many years and that she has done both a endoscopy and swallow study. She will eat something and feel it gets stuck in back of throat and will drink something to help it pass. She states when she had the EGD back in 11/23 they stretched her esophagus which helped for a short time. She states she also has thick mucus in back of her throat that causes her to gag. She has been referred to ENT doctor and has a appointment this coming Monday. She does have post nasal drip and dry cough. Weight stable. Appetite good. Patient denies GERD thumbs. She states she was on antiacids for some time without improvement in above symptoms.       Patient also complains of abdominal pain.  She reports she has left lower quadrant pain as well as right upper quadrant pain.  She reports the pain is worse  when she coughs. No known triggers. No new medications. No dietary changes.  She reports she cooks most of her meals at home.  Patient refrains from physical exercise as she states it will cause her to start coughing.     Patient has 1-3 stools per day. Her stools tend to be more loose with occasional constipation. She states the loose stool started after her gallbladder was removed. No blood in stool. She states when she is constipated or bloated it causes her abdominal pain. She states herbal teas help with the discomfort. She also cut out bread, milk, and fatty foods which helps.  She reports she socially drinks. She states she use to drink weekly in [place] for celebrations where she would share 5 bottles of tequila between her and two friends. She did this from age 69 to 65. Nonsmoker.  Patient's family history includes brother with cirrhosis (alcohol abuse) passed away at 6.  She reports no known history of liver disease.  Wt Readings from Last 3 Encounters:  01/13/24 195 lb (88.5 kg)  11/23/23 193 lb (87.5 kg)  11/05/23 194 lb 3.2 oz (88.1 kg)    Past Medical History:  Diagnosis Date   Bronchitis    Gestational diabetes    UTI (urinary tract infection)     Past Surgical History:  Procedure Laterality Date   CESAREAN SECTION     TUBAL LIGATION      Current Outpatient Medications  Medication Sig Dispense Refill   atorvastatin (  LIPITOR) 20 MG tablet Take by mouth.     albuterol (PROVENTIL) (2.5 MG/3ML) 0.083% nebulizer solution  (Patient not taking: Reported on 01/13/2024)     tirzepatide (ZEPBOUND) 2.5 MG/0.5ML injection vial Inject 2.5 mg into the skin once a week. (Patient not taking: Reported on 01/13/2024) 2 mL 1   No current facility-administered medications for this visit.    Allergies as of 01/13/2024   (No Known Allergies)    Family History  Problem Relation Age of Onset   Cancer Mother 13       endometrial   Diabetes Maternal Aunt     Review of Systems:     Constitutional: No weight loss, fever, chills, weakness or fatigue HEENT: Eyes: No change in vision               Ears, Nose, Throat:  No change in hearing or congestion Skin: No rash or itching Cardiovascular: No chest pain, chest pressure or palpitations   Respiratory: No SOB or cough Gastrointestinal: See HPI and otherwise negative Genitourinary: No dysuria or change in urinary frequency Neurological: No headache, dizziness or syncope Musculoskeletal: No new muscle or joint pain Hematologic: No bleeding or bruising Psychiatric: No history of depression or anxiety    Physical Exam:  Vital signs: BP 124/74   Pulse 72   Ht 5\' 1"  (1.549 m)   Wt 195 lb (88.5 kg)   SpO2 98%   BMI 36.84 kg/m   Constitutional:  Pleasant  female appears to be in NAD, Well developed, Well nourished, alert and cooperative Throat: Oral cavity and pharynx without inflammation, swelling or lesion.  Respiratory: Respirations even and unlabored. Lungs clear to auscultation bilaterally.   Wheezes noted. Cardiovascular: Normal S1, S2. Regular rate and rhythm. No peripheral edema, cyanosis or pallor.  Gastrointestinal:  Soft, nondistended, generalized abdominal tenderness.Obese. No rebound or guarding. Normal bowel sounds. No appreciable masses or hepatomegaly. Rectal:  Not performed.  Msk:  Symmetrical without gross deformities. Without edema, no deformity or joint abnormality.  Neurologic:  Alert and  oriented x4;  grossly normal neurologically.  Skin:   Dry and intact without significant lesions or rashes. Psychiatric: Oriented to person, place and time. Demonstrates good judgement and reason without abnormal affect or behaviors.  RELEVANT LABS AND IMAGING: CBC    07/19/2017   12:00 AM  CBC  Hemoglobin 13.3      Hematocrit 38      Platelets 302         This result is from an external source.     CMP     Latest Ref Rng & Units 11/05/2023    4:12 PM 08/25/2017    2:37 PM  CMP  Glucose 65 - 99  mg/dL  161   BUN 6 - 20 mg/dL  8   Creatinine 0.96 - 1.00 mg/dL  0.45   Sodium 409 - 811 mmol/L  139   Potassium 3.5 - 5.2 mmol/L  4.0   Chloride 96 - 106 mmol/L  102   CO2 20 - 29 mmol/L  22   Calcium 8.7 - 10.2 mg/dL  9.5   Total Protein 6.1 - 8.1 g/dL 6.4  6.3   Total Bilirubin 0.2 - 1.2 mg/dL 0.3  <9.1   Alkaline Phos 39 - 117 IU/L  89   AST 10 - 35 U/L 13  21   ALT 6 - 29 U/L 15  20      Lab Results  Component Value Date   TSH 0.430 (  L) 08/25/2017  11/23/23 H pylori breath test - not detected 09/23/22 EGD/colonoscopy - [facility] with Rami Elveria Rising, MD  Colonoscopy  Findings  All examined segments of the colon appeared normal.  Medium Internal hemorrhoids were seen on rectal retroflection.   Repeat screening colonoscopy in 10 years  EGD- unable to locate report 02/15/2019 CT ABDOMEN AND PELVIS WITH CONTRAST (care everywhere) IMPRESSION:  Small solitary gallstone is noted. No other abnormality seen in the  abdomen or pelvis.  03/01/2019 Esophagram IMPRESSION:  No abnormality seen in the esophagus.  11/25/23 CT CHEST, ABDOMEN AND PELVIS WITHOUT CONTRAST  IMPRESSION: 1. No acute abnormality in the chest, abdomen or pelvis. 2. Hepatomegaly with hepatic steatosis which in the setting of steatohepatitis can be a cause of abdominal pain suggest correlation with serum LFTs. 3. Asymmetric sclerosis of the left SI joint, which Arland Usery reflect sacroiliitis. 4. Small fat containing umbilical hernia.  Assessment: Encounter Diagnoses  Name Primary?   Esophageal dysphagia Yes   Bile salt-induced diarrhea    Altered bowel habits    Hepatomegaly    Hepatic steatosis    Generalized abdominal pain       For the hepatomegaly and hepatic steatosis incidentally found on CT scan A/P I will proceed with full liver lab workup ruling out any autoimmune, genetic, or viral cause.  I will also order liver ultrasound elastography to evaluate level of fibrosis.  Recommend patient  abstain from alcohol use.  Encourage physical exercise as tolerated with weight loss.  Her LFTs last recorded are normal.     For the altered bowel habits we discussed adding more fiber into her diet with Citrucel 1 tsp po daily.  We also talked about trialing cholestyramine due to loose stools after gallbladder removal indicating possible bile salt malabsorption issue, patient declined due to concerns it Latravis Grine cause constipation.  Will provide samples of IBgard and recommend low FODMAP diet.  Patient is up-to-date on colonoscopy in 11/23 which was normal.  She Caz Weaver have a functional component therefore we did discuss adding anticholinergic hyoscyamine, patient declined.       For the esophageal dysphagia not improved with PPI therapy patient has had unremarkable workup in the past, unable to locate findings on EGD. Esophagram is normal. She does have post nasal drip with dry cough. Will wait for her ENT appointment to see what they recommend. Tylan Briguglio consider EGD in future if not related to her seasonal allergies and postnasal drip.   Plan: -Start Citrucel 1 tsp po daily -Recommend cholestyramine- patient declines -Recommended hyoscyamine- patient declines  -Low fodmap diet -IBgard samples provided -Education on Alcohol cessation -Order liver US elastography -ANA, AMA, Anti-smooth muscle antibody, Hepatitis A IgG, Hepatitis B surface antigen, Hepatitis B surface antibody, HCV antibody, ferritin, TIBC,  Alpha 1 antitrypsin, ceruloplasmin, tTG, total IgA, IgG, PT/INR -Follow-up with   Thank you for the courtesy of this consult. Please call me with any questions or concerns.   Marlos Carmen, FNP-C [facility] 01/13/2024, 3:57 PM  Cc: Maretta Bees, PA

## 2024-01-17 ENCOUNTER — Encounter (INDEPENDENT_AMBULATORY_CARE_PROVIDER_SITE_OTHER): Payer: Self-pay | Admitting: Otolaryngology

## 2024-01-17 ENCOUNTER — Ambulatory Visit (INDEPENDENT_AMBULATORY_CARE_PROVIDER_SITE_OTHER): Payer: Self-pay | Admitting: Otolaryngology

## 2024-01-17 VITALS — BP 143/81 | HR 76 | Ht 61.0 in

## 2024-01-17 DIAGNOSIS — R0982 Postnasal drip: Secondary | ICD-10-CM

## 2024-01-17 DIAGNOSIS — R0989 Other specified symptoms and signs involving the circulatory and respiratory systems: Secondary | ICD-10-CM

## 2024-01-17 DIAGNOSIS — R208 Other disturbances of skin sensation: Secondary | ICD-10-CM

## 2024-01-17 DIAGNOSIS — J3089 Other allergic rhinitis: Secondary | ICD-10-CM

## 2024-01-17 DIAGNOSIS — J385 Laryngeal spasm: Secondary | ICD-10-CM

## 2024-01-17 DIAGNOSIS — R053 Chronic cough: Secondary | ICD-10-CM

## 2024-01-17 DIAGNOSIS — R1310 Dysphagia, unspecified: Secondary | ICD-10-CM

## 2024-01-17 DIAGNOSIS — J343 Hypertrophy of nasal turbinates: Secondary | ICD-10-CM

## 2024-01-17 DIAGNOSIS — R0981 Nasal congestion: Secondary | ICD-10-CM

## 2024-01-17 DIAGNOSIS — K219 Gastro-esophageal reflux disease without esophagitis: Secondary | ICD-10-CM

## 2024-01-17 DIAGNOSIS — M26623 Arthralgia of bilateral temporomandibular joint: Secondary | ICD-10-CM

## 2024-01-17 DIAGNOSIS — R070 Pain in throat: Secondary | ICD-10-CM

## 2024-01-17 MED ORDER — CETIRIZINE HCL 10 MG PO TABS: 10.0000 mg | ORAL_TABLET | Freq: Every day | ORAL | 11 refills | Status: AC

## 2024-01-17 MED ORDER — MOMETASONE FUROATE 50 MCG/ACT NA SUSP: 2.0000 | Freq: Every day | NASAL | 12 refills | Status: AC

## 2024-01-17 MED ORDER — SALINE SPRAY 0.65 % NA SOLN: 1.0000 | NASAL | 5 refills | Status: AC | PRN

## 2024-01-17 MED ORDER — FAMOTIDINE 20 MG PO TABS: 20.0000 mg | ORAL_TABLET | Freq: Two times a day (BID) | ORAL | 3 refills | Status: AC

## 2024-01-17 NOTE — Patient Instructions (Addendum)
-   Sweet Oil - try this over the counter product for itchy ears    GamingLesson.nl - check out this website to learn more about reflux   -Avoid lying down for at least two hours after a meal or after drinking acidic beverages, like soda, or other caffeinated beverages. This can help to prevent stomach contents from flowing back into the esophagus. -Keep your head elevated while you sleep. Using an extra pillow or two can also help to prevent reflux. -Eat smaller and more frequent meals each day instead of a few large meals. This promotes digestion and can aid in preventing heartburn. -Wear loose-fitting clothes to ease pressure on the stomach, which can worsen heartburn and reflux. -Reduce excess weight around the midsection. This can ease pressure on the stomach. Such pressure can force some stomach contents back up the esophagus - Take Reflux Gourmet (natural supplement available on [employer]) to help with symptoms of chronic throat irritation   +    TMJ (Temporomandibular Joint Syndrome)  The temporomandibular (tem-puh-[id number]-man-DIB-u-lur) joint (TMJ) acts like a sliding hinge, connecting your jawbone to your skull. You have one joint on each side of your jaw. TMJ disorders -- a type of temporomandibular disorder or TMD -- can cause pain in your jaw joint and in the muscles that control jaw movement.  The exact cause of a person's TMJ disorder is often difficult to determine. Your pain may be due to a combination of factors, such as genetics, arthritis or jaw injury. Some people who have jaw pain also tend to clench or grind their teeth (bruxism), although many people habitually clench or grind their teeth and never develop TMJ disorders.  In most cases, the pain and discomfort associated with TMJ disorders is temporary and can be relieved with self-managed care or nonsurgical treatments. This includes stress reduction, softer diet when the pain is present, anti-inflammatory pain  medications such as Motrin and warm compresses.

## 2024-01-17 NOTE — Progress Notes (Signed)
 ENT CONSULT:  Reason for Consult: burning in the nose/itching and ear itching cough and sensation of throat closing off x 6 yrs  HPI: Discussed the use of AI scribe software for clinical note transcription with the patient, who gave verbal consent to proceed.  History of Present Illness   Victoria Russo is a 48 year old female who presents with chronic itching in her ears, burning in her nose, and episodes of throat tightness/cough and food getting stuck when she eats at times.  She experiences chronic itching in her nose and ears, along with episodes of throat tightness and sticky saliva production, persisting for approximately six years. The throat tightness sometimes causes her throat to 'close up,' leading to coughing. Previous specialist evaluations did not identify any issues at that time.  In December, 2024 she visited the emergency room due to her throat closing up. During this visit tests were conducted to rule out asthma, bronchitis, and COVID-19, all of which were negative. An X-ray revealed a nodule in her right lung, which was confirmed by a CT scan to be stable in size compared to four years ago. She underwent multiple scans in January, including those of her neck, chest, and abdomen, to investigate the cause of her throat closing up. She continues to experience episodes of throat tightness, particularly at night, which affects her sleep as she needs to elevate her head to prevent choking on saliva. Records indicate she had no evidence of OSA on sleep study 11/30/23.  She denies having a history of lung disease. She had previous EGD endoscopy two years ago, which was performed to address a narrowing in her esophagus This procedure provided temporary relief. She has not been taking medication for reflux. She experiences difficulty swallowing at times.  She has a history of an enlarged and fatty liver, as identified in a study, with recent abnormal lab results. She is scheduled  for a more specific liver study later this week. She is unsure of the exact nature of the upcoming study but believes it involves imaging.     Records Reviewed:  Office visit GI 01/13/24  Patient is a  48  year old female patient with past medical history of high cholesterol, gestational diabetes, obesity, who was referred to me by Maretta Bees, PA on 12/01/23 for a complaint of abdominal bloating, hepatomegaly, hepatic steatosis .     11/23/23 patient seen in the office by PCP with main complaint of persistent abdominal pain, primarily in the right upper quadrant.  The discomfort is described as a sharp stabbing pain accompanied by sensation of swelling and bloating.  The patient has tried protein pump inhibitors without relief.  HPI breath test ordered which was negative.   Interval History      Due to language barrier, an interpreter was present during the history-taking and subsequent discussion (and for part of the physical exam) with this patient.    During this his examination it was difficult to get a clear history from the patient.  Patient does present with several gastrointestinal complaints.  Her first complaint is of esophageal dysphagia with solids.  She states she has had this issue for many years and that she has done both a endoscopy and swallow study. She will eat something and feel it gets stuck in back of throat and will drink something to help it pass. She states when she had the EGD back in 11/23 they stretched her esophagus which helped for a short time. She states  she also has thick mucus in back of her throat that causes her to gag. She has been referred to ENT doctor and has a appointment this coming Monday. She does have post nasal drip and dry cough. Weight stable. Appetite good. Patient denies GERD thumbs. She states she was on antiacids for some time without improvement in above symptoms.       Patient also complains of abdominal pain.  She reports she has left lower  quadrant pain as well as right upper quadrant pain.  She reports the pain is worse when she coughs. No known triggers. No new medications. No dietary changes.  She reports she cooks most of her meals at home.  Patient refrains from physical exercise as she states it will cause her to start coughing.     Patient has 1-3 stools per day. Her stools tend to be more loose with occasional constipation. She states the loose stool started after her gallbladder was removed. No blood in stool. She states when she is constipated or bloated it causes her abdominal pain. She states herbal teas help with the discomfort. She also cut out bread, milk, and fatty foods which helps.  She reports she socially drinks. She states she use to drink weekly in [place] for celebrations where she would share 5 bottles of tequila between her and two friends. She did this from age 10 to 59. Nonsmoker.  Patient's family history includes brother with cirrhosis (alcohol abuse) passed away at 34.  She reports no known history of liver disease.   For the hepatomegaly and hepatic steatosis incidentally found on CT scan A/P I will proceed with full liver lab workup ruling out any autoimmune, genetic, or viral cause.  I will also order liver ultrasound elastography to evaluate level of fibrosis.  Recommend patient abstain from alcohol use.  Encourage physical exercise as tolerated with weight loss.  Her LFTs last recorded are normal.     For the altered bowel habits we discussed adding more fiber into her diet with Citrucel 1 tsp po daily.  We also talked about trialing cholestyramine due to loose stools after gallbladder removal indicating possible bile salt malabsorption issue, patient declined due to concerns it may cause constipation.  Will provide samples of IBgard and recommend low FODMAP diet.  Patient is up-to-date on colonoscopy in 11/23 which was normal.  She may have a functional component therefore we did discuss adding anticholinergic  hyoscyamine, patient declined.       For the esophageal dysphagia not improved with PPI therapy patient has had unremarkable workup in the past, unable to locate findings on EGD. Esophagram is normal. She does have post nasal drip with dry cough. Will wait for her ENT appointment to see what they recommend. May consider EGD in future if not related to her seasonal allergies and postnasal drip.    Plan: -Start Citrucel 1 tsp po daily -Recommend cholestyramine- patient declines -Recommended hyoscyamine- patient declines  -Low fodmap diet -IBgard samples provided -Education on Alcohol cessation -Order liver US elastography -ANA, AMA, Anti-smooth muscle antibody, Hepatitis A IgG, Hepatitis B surface antigen, Hepatitis B surface antibody, HCV antibody, ferritin, TIBC,  Alpha 1 antitrypsin, ceruloplasmin, tTG, total IgA, IgG, PT/INR -Follow-up with    Past Medical History:  Diagnosis Date   Bronchitis    Gestational diabetes    UTI (urinary tract infection)     Past Surgical History:  Procedure Laterality Date   CESAREAN SECTION     TUBAL LIGATION  Family History  Problem Relation Age of Onset   Cancer Mother 84       endometrial   Diabetes Maternal Aunt     Social History:  reports that she has never smoked. She has never used smokeless tobacco. She reports current alcohol use. She reports that she does not use drugs.  Allergies: No Known Allergies  Medications: I have reviewed the patient's current medications.  The PMH, PSH, Medications, Allergies, and SH were reviewed and updated.  ROS: Constitutional: Negative for fever, weight loss and weight gain. Cardiovascular: Negative for chest pain and dyspnea on exertion. Respiratory: Is not experiencing shortness of breath at rest. Gastrointestinal: Negative for nausea and vomiting. Neurological: Negative for headaches. Psychiatric: The patient is not nervous/anxious  Blood pressure (!) 143/81, pulse 76, height 5\' 1"   (1.549 m), SpO2 96%, unknown if currently breastfeeding. Body mass index is 36.84 kg/m.  PHYSICAL EXAM:  Exam: General: Well-developed, well-nourished Communication and Voice: Clear pitch and clarity Respiratory Respiratory effort: Equal inspiration and expiration without stridor Cardiovascular Peripheral Vascular: Warm extremities with equal color/perfusion Eyes: No nystagmus with equal extraocular motion bilaterally Neuro/Psych/Balance: Patient oriented to person, place, and time; Appropriate mood and affect; Gait is intact with no imbalance; Cranial nerves I-XII are intact Head and Face Inspection: Normocephalic and atraumatic without mass or lesion Palpation: Facial skeleton intact without bony stepoffs Salivary Glands: No mass or tenderness Facial Strength: Facial motility symmetric and full bilaterally ENT Pinna: External ear intact and fully developed External canal: Canal is patent with intact skin Tympanic Membrane: Clear and mobile External Nose: No scar or anatomic deformity Internal Nose: Septum is deviated and S-shaped with narrow nasal passages b/l. No polyp, or purulence. Mucosal edema and erythema present.  Bilateral inferior turbinate hypertrophy.  Lips, Teeth, and gums: Mucosa and teeth intact and viable TMJ: No pain to palpation with full mobility Oral cavity/oropharynx: No erythema or exudate, no lesions present Nasopharynx: No mass or lesion with intact mucosa Hypopharynx: Intact mucosa without pooling of secretions Larynx Glottic: Full true vocal cord mobility without lesion or mass Supraglottic: Normal appearing epiglottis and AE folds Interarytenoid Space: Moderate pachydermia&edema Subglottic Space: Patent without lesion or edema Neck Neck and Trachea: Midline trachea without mass or lesion Thyroid: No mass or nodularity Lymphatics: No lymphadenopathy  Procedure: Preoperative diagnosis: throat tightness and discomfort, dysphagia   Postoperative  diagnosis:   Same + GERD LPR  Procedure: Flexible fiberoptic laryngoscopy  Surgeon: Ashok Croon, MD  Anesthesia: Topical lidocaine and Afrin Complications: None Condition is stable throughout exam  Indications and consent:  The patient presents to the clinic with above symptoms. Indirect laryngoscopy view was incomplete. Thus it was recommended that they undergo a flexible fiberoptic laryngoscopy. All of the risks, benefits, and potential complications were reviewed with the patient preoperatively and verbal informed consent was obtained.  Procedure: The patient was seated upright in the clinic. Topical lidocaine and Afrin were applied to the nasal cavity. After adequate anesthesia had occurred, I then proceeded to pass the flexible telescope into the nasal cavity. The nasal cavity was patent without rhinorrhea or polyp. The nasopharynx was also patent without mass or lesion. The base of tongue was visualized and was normal. There were no signs of pooling of secretions in the piriform sinuses. The true vocal folds were mobile bilaterally. There were no signs of glottic or supraglottic mucosal lesion or mass. There was moderate interarytenoid pachydermia and post cricoid edema. The telescope was then slowly withdrawn and the patient tolerated the  procedure throughout.    PROCEDURE NOTE: nasal endoscopy  Preoperative diagnosis: chronic nasal congestion and burning in the nasal passages   Postoperative diagnosis: same  Procedure: Diagnostic nasal endoscopy (40981)  Surgeon: Ashok Croon, M.D.  Anesthesia: Topical lidocaine and Afrin  H&P REVIEW: The patient's history and physical were reviewed today prior to procedure. All medications were reviewed and updated as well. Complications: None Condition is stable throughout exam Indications and consent: The patient presents with symptoms of chronic sinusitis not responding to previous therapies. All the risks, benefits, and potential  complications were reviewed with the patient preoperatively and informed consent was obtained. The time out was completed with confirmation of the correct procedure.   Procedure: The patient was seated upright in the clinic. Topical lidocaine and Afrin were applied to the nasal cavity. After adequate anesthesia had occurred, the rigid nasal endoscope was passed into the nasal cavity. The nasal mucosa, turbinates, septum, and sinus drainage pathways were visualized bilaterally. This revealed no purulence or significant secretions that might be cultured. There were no polyps or sites of significant inflammation. The mucosa was intact and there was no crusting present. The scope was then slowly withdrawn and the patient tolerated the procedure well. There were no complications or blood loss.   Studies Reviewed: DG Chest 2 View Result Date: 10/29/2023 CLINICAL DATA:  Shortness of breath.  Cough and wheezing EXAM: CHEST - 2 VIEW COMPARISON:  None Available. FINDINGS: No consolidation, pneumothorax or effusion. No edema. Normal cardiopericardial silhouette. There is a right upper lobe lung nodule which is relatively dense and could be calcified but indeterminate. IMPRESSION: Right upper lung nodule. Uncertain etiology. Please correlate with any prior or dedicated CT scan when appropriate to confirm etiology. No consolidation or effusion Electronically Signed   By: Scarlette Shorts     CT chest 11/25/23 IMPRESSION: 1. No acute abnormality in the chest, abdomen or pelvis. 2. Hepatomegaly with hepatic steatosis which in the setting of steatohepatitis can be a cause of abdominal pain suggest correlation with serum LFTs. 3. Asymmetric sclerosis of the left SI joint, which may reflect sacroiliitis. 4. Small fat containing umbilical hernia.  CT neck with contrast 11/09/23 FINDINGS: Pharynx and larynx: Unremarkable.  No mass or swelling.   Salivary glands: Parotid and submandibular glands are unremarkable.   Thyroid:  Normal.   Lymph nodes: No enlarged or abnormal density nodes.   Vascular: Major neck vessels are patent.   Limited intracranial: No abnormal enhancement.   Visualized orbits: Unremarkable.   Mastoids and visualized paranasal sinuses: Mastoid air cells are clear. Minor mucosal thickening.   Skeleton: Periapical lucencies about the anterior right maxillary molar.   Upper chest: Included lungs are clear.   Other: None.   IMPRESSION: No neck mass or adenopathy.  Assessment/Plan: Encounter Diagnoses  Name Primary?   Throat discomfort    Chronic GERD    Chronic cough Yes   Throat tightness    Environmental and seasonal allergies    Post-nasal drip    Chronic nasal congestion    Hypertrophy of both inferior nasal turbinates    Dysphagia, unspecified type    Laryngospasm    Bilateral temporomandibular joint pain    Nasal burning [R20.8]     Assessment and Plan    Laryngeal spasm Chronic recurrent sensation of throat tightness and episodes of throat closure for six years. Flexible laryngoscopy today is reassuring without masses, lesions or adduction of the VF on inspiration. Negative sleep study Jan 2025. Laryngospasm in the setting of GERD  LPR or VCD are potential causes vs pulmonary issues. Symptoms likely due to laryngeal spasm since it occurs mostly at night. She also has dysphagia to solids at times, where food gets stuck in her throat. This could be CP spasm in the setting of GERD LPR vs less likely oropharyngeal or esophageal dysphagia. - Order swallow study to evaluate dysphagia - MBS esophagram - Prescribe medication for reflux and diet/lifestyle changes   GERD LPR - Pepcid 20 mg BID  -  Reflux Gourmet after meals - diet and lifestyle changes to minimize GERD - Refer to [employer] blog for dietary and lifestyle modifications/reflux cook book  Dysphagia to solids - MBS esophagram   Nasal itching and burning sensation Chronic nasal itching and burning,  possibly due to allergies and inflammation. No anatomical abnormalities observed on nasal endoscopy no polyps or lesions no pus.  Chronic nasal congestion and post-nasal drainage Evidence of post-nasal drainage during scope exam today, could be contributing to her sx - trial of Zyrtec 10 mg daily and Flonase 2 puffs b/l nares BID - consider nasal saline rinses   Ear itching Intermittent ear itching, possibly due to dry air. No signs of eczema or infection on ear exam today. - Recommend sweet oil for ear itching  Temporomandibular joint (TMJ) disorder Intermittent headaches and jaw pain suggestive of TMJ disorder. - Provide TMJ pain management suggestions in after-visit summary     RTC after swallow study   Thank you for allowing me to participate in the care of this patient. Please do not hesitate to contact me with any questions or concerns.   Ashok Croon, MD Otolaryngology [facility] Phone: [phone] Fax: [phone]    01/17/2024, 3:04 PM

## 2024-01-17 NOTE — Progress Notes (Signed)
 I agree with the assessment and plan as outlined by Ms. May.

## 2024-01-18 ENCOUNTER — Telehealth (HOSPITAL_COMMUNITY): Payer: Self-pay | Admitting: *Deleted

## 2024-01-18 LAB — HEPATITIS C ANTIBODY: Hepatitis C Ab: NONREACTIVE

## 2024-01-18 LAB — HEPATITIS A ANTIBODY, TOTAL: Hepatitis A AB,Total: REACTIVE — AB

## 2024-01-18 LAB — IGA: Immunoglobulin A: 296 mg/dL (ref 47–310)

## 2024-01-18 LAB — ANA: Anti Nuclear Antibody (ANA): POSITIVE — AB

## 2024-01-18 LAB — TISSUE TRANSGLUTAMINASE ABS,IGG,IGA
(tTG) Ab, IgA: <1 U/mL
(tTG) Ab, IgG: <1 U/mL

## 2024-01-18 LAB — MITOCHONDRIAL ANTIBODIES: Mitochondrial M2 Ab, IgG: <=20 U (ref <=20.0)

## 2024-01-18 LAB — ANTI-NUCLEAR AB-TITER (ANA TITER): ANA Titer 1: 1:40 {titer} — ABNORMAL HIGH

## 2024-01-18 LAB — ALPHA-1-ANTITRYPSIN: A-1 Antitrypsin, Ser: 185 mg/dL (ref 83–199)

## 2024-01-18 LAB — HEPATITIS B SURFACE ANTIGEN: Hepatitis B Surface Ag: NONREACTIVE

## 2024-01-18 LAB — ANTI-SMOOTH MUSCLE ANTIBODY, IGG: Actin (Smooth Muscle) Antibody (IGG): <20 U (ref <20)

## 2024-01-18 LAB — HEPATITIS B SURFACE ANTIBODY,QUALITATIVE: Hep B S Ab: REACTIVE — AB

## 2024-01-18 LAB — IGG: IgG (Immunoglobin G), Serum: 957 mg/dL (ref 600–1640)

## 2024-01-18 NOTE — Telephone Encounter (Signed)
Attempted to contact patient via interpreter services to schedule OP MBS. VM not setup, unable to leave message. RKEEL

## 2024-01-20 ENCOUNTER — Other Ambulatory Visit: Payer: Self-pay

## 2024-01-20 ENCOUNTER — Ambulatory Visit (HOSPITAL_COMMUNITY)
Admission: RE | Admit: 2024-01-20 | Discharge: 2024-01-20 | Disposition: A | Discharge status: Home / Self Care | Payer: Self-pay | Source: Ambulatory Visit | Attending: Gastroenterology | Admitting: Gastroenterology

## 2024-01-20 DIAGNOSIS — R1084 Generalized abdominal pain: Secondary | ICD-10-CM

## 2024-01-20 DIAGNOSIS — R160 Hepatomegaly, not elsewhere classified: Secondary | ICD-10-CM

## 2024-01-20 DIAGNOSIS — K760 Fatty (change of) liver, not elsewhere classified: Secondary | ICD-10-CM

## 2024-01-20 DIAGNOSIS — R1319 Other dysphagia: Secondary | ICD-10-CM

## 2024-01-20 DIAGNOSIS — K9089 Other intestinal malabsorption: Secondary | ICD-10-CM

## 2024-01-20 DIAGNOSIS — R194 Change in bowel habit: Secondary | ICD-10-CM

## 2024-01-21 LAB — CERULOPLASMIN: Ceruloplasmin: 36 mg/dL (ref 14–48)

## 2024-01-25 ENCOUNTER — Other Ambulatory Visit (HOSPITAL_COMMUNITY): Payer: Self-pay | Admitting: Otolaryngology

## 2024-01-25 DIAGNOSIS — R1310 Dysphagia, unspecified: Secondary | ICD-10-CM

## 2024-02-02 ENCOUNTER — Ambulatory Visit (HOSPITAL_COMMUNITY)
Admission: RE | Admit: 2024-02-02 | Discharge: 2024-02-02 | Disposition: A | Discharge status: Home / Self Care | Payer: Self-pay | Source: Ambulatory Visit | Attending: Otolaryngology | Admitting: Otolaryngology

## 2024-02-02 ENCOUNTER — Ambulatory Visit (HOSPITAL_COMMUNITY)
Admission: RE | Admit: 2024-02-02 | Discharge: 2024-02-02 | Disposition: A | Discharge status: Home / Self Care | Payer: Self-pay | Source: Ambulatory Visit | Attending: *Deleted | Admitting: *Deleted

## 2024-02-02 DIAGNOSIS — K224 Dyskinesia of esophagus: Secondary | ICD-10-CM | POA: Insufficient documentation

## 2024-02-02 DIAGNOSIS — R1310 Dysphagia, unspecified: Secondary | ICD-10-CM | POA: Insufficient documentation

## 2024-02-02 NOTE — Evaluation (Signed)
 Modified Barium Swallow Study  Patient Details  Name: Victoria Russo MRN: [id number] Date of Birth: 1976/01/30  Today's Date: 02/02/2024  Modified Barium Swallow completed.  Full report located under Chart Review in the Imaging Section.  History of Present Illness Pt is a 48 y.o. female who presents with c/o swallowing difficulties. Pt describes that she started having swallowing issues around 6 years ago; but, it has recently gotten worse. Pt notes that she has more trouble with swallowing solids, but still occasionally has issues with liquids. She states that when she is eating, she feels a lingering sensation that she associates with food being stuck in her throat. In other instances, pt experiences increased phlegm and thick mucous during moments of swallowing difficulty. Pt was recently seen by laryngology on 03/17, where she notes having problems with her throat closing. These problems typically occur during the day when she is exerting herself physically and if she lays flat when sleeping. Pt slightly elevates her head at night to decrease these incidents. Pt describes that she feels a burning sensation when these episodes start.   Clinical Impression Pt presents with swallowing grossly WFL. No aspiration or penetration observed. Pt's swallow demonstrated adequate efficiency and safety from [facility] hyolaryngeal excursion, epiglottic closure, and pharyngeal clearance. Trace oropharyngeal residue was noted but did not impact pt's swallow safety. Pt's swallow initiation ranged between the vallecular and pyriform levels; still WFL due to pt's adequate bolus clearance and airway safety. Pt displayed coughing after completing a bolus trial 3 times with no objective signs of aspiration.    Due to pt's hx with throat closing with a burning sensation and observations from this eval, pt's symptoms are suggestive of irritable larynx syndrome, possibly associated with etiology currently being  determined by laryngologist. Pt may benefit from outpatient SLP services focused on breathing strategies for cough/VCD and general vocal hygiene to promote pt's wellbeing and QOL.   Factors that may increase risk of adverse event in presence of aspiration [employer]ye [employer]aks & Clearance Coots 2021):    Swallow Evaluation Recommendations        Rowe Robert 02/02/2024,12:52 PM

## 2024-03-10 ENCOUNTER — Telehealth: Payer: Self-pay

## 2024-03-10 NOTE — Telephone Encounter (Signed)
 Called patient using interpreter services and the interpreters headset had an issue and accidentally hung up on the patient.  Interpreter services called twice and I called once to see if the patient would pick-up again and she did not and there is no VM.  Will send MyChart message but patients last access was 02/11/2024.

## 2024-03-20 ENCOUNTER — Ambulatory Visit (INDEPENDENT_AMBULATORY_CARE_PROVIDER_SITE_OTHER): Payer: Self-pay | Admitting: Otolaryngology

## 2024-06-15 ENCOUNTER — Encounter: Payer: Self-pay | Admitting: Urgent Care

## 2024-06-15 NOTE — Telephone Encounter (Signed)
 Per google translates as below: HELLO DOCTOR, GOOD AFTERNOON. I RECEIVED AN EMAIL TO SCHEDULE AN APPOINTMENT FOR MY PAP SCREENING AND MAMMOGRAM. SHOULD I SCHEDULE AN APPOINTMENT WITH YOU FIRST? OR ARE YOU GOING TO REFER ME TO A GYNECOLOGIST?

## 2024-06-22 NOTE — Telephone Encounter (Signed)
 Per google translates: Okay, thank you very much, Doctor. See you soon.

## 2024-06-23 ENCOUNTER — Ambulatory Visit: Payer: Self-pay | Admitting: Urgent Care

## 2024-06-23 ENCOUNTER — Encounter: Payer: Self-pay | Admitting: Urgent Care

## 2024-06-23 ENCOUNTER — Other Ambulatory Visit (HOSPITAL_COMMUNITY)
Admission: RE | Admit: 2024-06-23 | Discharge: 2024-06-23 | Disposition: A | Discharge status: Home / Self Care | Payer: Self-pay | Source: Ambulatory Visit | Attending: Urgent Care | Admitting: Urgent Care

## 2024-06-23 VITALS — BP 115/58 | HR 68 | Resp 17 | Ht 61.0 in | Wt 203.0 lb

## 2024-06-23 DIAGNOSIS — Z8049 Family history of malignant neoplasm of other genital organs: Secondary | ICD-10-CM

## 2024-06-23 DIAGNOSIS — N888 Other specified noninflammatory disorders of cervix uteri: Secondary | ICD-10-CM

## 2024-06-23 DIAGNOSIS — Z124 Encounter for screening for malignant neoplasm of cervix: Secondary | ICD-10-CM

## 2024-06-23 DIAGNOSIS — N9489 Other specified conditions associated with female genital organs and menstrual cycle: Secondary | ICD-10-CM

## 2024-06-23 DIAGNOSIS — N889 Noninflammatory disorder of cervix uteri, unspecified: Secondary | ICD-10-CM

## 2024-06-23 DIAGNOSIS — Z1239 Encounter for other screening for malignant neoplasm of breast: Secondary | ICD-10-CM

## 2024-06-23 NOTE — Patient Instructions (Signed)
 Please obtain a pelvic ultrasound. Schedule in [address]. Please obtain mammogram.   I have referred you to gynecology for further evaluation.

## 2024-06-23 NOTE — Progress Notes (Signed)
 Established Patient Office Visit  Subjective:  Patient ID: Victoria Russo, female    DOB: [birth year]  Age: 48 y.o. MRN: [id number]  Chief Complaint  Patient presents with   Gynecologic Exam    Pleasant [phone] female presents today for GU exam. Has family hx of cervical cancer in her mother and aunt. She had an abnormal pap smear 15 years ago, but not since. She is concerned about some uterine/ pelvic discomfort.  Additionally, she had a pelvic US  previously that showed cysts and is wanting to know if these are the cause of her pain  Pt due for mammogram screening.  She has hx of dense breast tissue. Had an abnormal mammogram on the R in the past and was told to have follow up US  completed.   Gynecologic Exam    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 11/05/2023   Alkaline phosphatase elevation 11/05/2023   Submental mass 11/05/2023   Hypertriglyceridemia 07/13/2023   History of colon polyps 03/26/2021   Obesity (BMI 30-39.9) 10/07/2017   Obesity in pregnancy 10/07/2017   Language barrier 10/07/2017   Past Medical History:  Diagnosis Date   Bronchitis    Gestational diabetes    UTI (urinary tract infection)    Past Surgical History:  Procedure Laterality Date   CESAREAN SECTION     TUBAL LIGATION     Social History   Tobacco Use   Smoking status: Never   Smokeless tobacco: Never  Vaping Use   Vaping status: Never Used  Substance Use Topics   Alcohol use: Yes    Comment: occasionally   Drug use: No      ROS: as noted in HPI  Objective:     BP (!) 115/58   Pulse 68   Resp 17   Ht 5' 1 (1.549 m)   Wt 203 lb (92.1 kg)   SpO2 99%   BMI 38.36 kg/m  BP Readings from Last 3 Encounters:  06/23/24  (!) 115/58  01/17/24 (!) 143/81  01/13/24 124/74   Wt Readings from Last 3 Encounters:  06/23/24 203 lb (92.1 kg)  01/13/24 195 lb (88.5 kg)  11/23/23 193 lb (87.5 kg)      Physical Exam Vitals and nursing note reviewed. Exam conducted with a chaperone present.  Constitutional:      General: She is not in acute distress.    Appearance: Normal appearance. She is obese. She is not ill-appearing, toxic-appearing or diaphoretic.  Cardiovascular:     Rate and Rhythm: Normal rate.  Pulmonary:     Effort: Pulmonary effort is normal. No respiratory distress.  Abdominal:     General: Abdomen is flat.     Palpations: Abdomen is soft.     Tenderness: There is abdominal tenderness (mild tenderness to suprapubic region/ uterus).  Genitourinary:    Pubic Area: No rash or pubic lice.      Labia:        Right: No rash, tenderness, lesion or injury.        Left: No rash, tenderness, lesion or injury.      Urethra: No prolapse, urethral pain, urethral swelling or urethral lesion.     Vagina: Normal. No vaginal discharge or lesions.     Cervix:  Friability and erythema present. No cervical motion tenderness, discharge or lesion.     Uterus: Normal. Tender.      Adnexa: Right adnexa normal and left adnexa normal.       Right: No mass, tenderness or fullness.         Left: No mass, tenderness or fullness.       Rectum: Normal.     Comments: The cervix was dark red in color, three nabothian cysts noted. It was hard to specifically locate the cervical os/ endocervix due to an abnormal growth on the superior portion.  Lymphadenopathy:     Lower Body: No right inguinal adenopathy. No left inguinal adenopathy.  Neurological:     Mental Status: She is alert.      No results found for any visits on 06/23/24.   Last CBC Lab Results  Component Value Date   HGB 13.3 07/19/2017   HCT 38 07/19/2017   PLT 302 07/19/2017   Last metabolic panel Lab Results  Component Value Date   GLUCOSE 145  (H) 08/25/2017   NA 139 08/25/2017   K 4.0 08/25/2017   CL 102 08/25/2017   CO2 22 08/25/2017   BUN 8 08/25/2017   CREATININE 0.54 (L) 08/25/2017   GFRNONAA 119 08/25/2017   CALCIUM 9.5 08/25/2017   PROT 6.7 01/13/2024   ALBUMIN 4.1 01/13/2024   LABGLOB 2.6 08/25/2017   AGRATIO 1.4 08/25/2017   BILITOT 0.3 01/13/2024   ALKPHOS 116 01/13/2024   AST 15 01/13/2024   ALT 15 01/13/2024   Last lipids No results found for: CHOL, HDL, LDLCALC, LDLDIRECT, TRIG, CHOLHDL Last hemoglobin A1c Lab Results  Component Value Date   HGBA1C 5.3 08/25/2017      The 10-year ASCVD risk score (Arnett DK, et al., 2019) is: 1.2%  Assessment & Plan:  Cervical cancer screening -     Cytology - PAP  Family history of cervical cancer -     Ambulatory referral to Obstetrics / Gynecology -     Cytology - PAP -     US  PELVIC COMPLETE WITH TRANSVAGINAL; Future  Abnormal appearance of cervix -     Ambulatory referral to Obstetrics / Gynecology -     US  PELVIC COMPLETE WITH TRANSVAGINAL; Future  Nabothian cyst -     Ambulatory referral to Obstetrics / Gynecology -     US  PELVIC COMPLETE WITH TRANSVAGINAL; Future  Abnormality of uterine cervix -     Ambulatory referral to Obstetrics / Gynecology -     US  PELVIC COMPLETE WITH TRANSVAGINAL; Future  Uterine pain -     Ambulatory referral to Obstetrics / Gynecology -     US  PELVIC COMPLETE WITH TRANSVAGINAL; Future  Breast cancer screening, high risk patient -     MM 3D DIAGNOSTIC MAMMOGRAM BILATERAL BREAST; Future -     US  LIMITED ULTRASOUND INCLUDING AXILLA RIGHT BREAST; Future  Pap smear obtained. There are several abnormalities noted on physical exam, most concerning for abnormal growth on the cervix. Given significant family hx of cervical cancer, I will refer to gyne regardless of pap smear results. Additionally, given tenderness will obtain US  of the uterus for further eval.  Will refer for dx mammo with US  of R breast given  personal hx of abnormalities on the R.   No follow-ups on file.   Benton LITTIE Gave, PA

## 2024-06-26 ENCOUNTER — Ambulatory Visit: Payer: Self-pay | Admitting: Urgent Care

## 2024-06-26 LAB — CYTOLOGY - PAP
Adequacy: ABSENT
Comment: NEGATIVE
Diagnosis: NEGATIVE
High risk HPV: NEGATIVE

## 2024-06-27 ENCOUNTER — Other Ambulatory Visit: Payer: Self-pay

## 2024-06-28 ENCOUNTER — Ambulatory Visit
Admission: EM | Admit: 2024-06-28 | Discharge: 2024-06-28 | Disposition: A | Discharge status: Home / Self Care | Payer: Self-pay

## 2024-06-28 ENCOUNTER — Ambulatory Visit: Payer: Self-pay

## 2024-06-28 DIAGNOSIS — R0602 Shortness of breath: Secondary | ICD-10-CM

## 2024-06-28 DIAGNOSIS — N9489 Other specified conditions associated with female genital organs and menstrual cycle: Secondary | ICD-10-CM

## 2024-06-28 DIAGNOSIS — Z8049 Family history of malignant neoplasm of other genital organs: Secondary | ICD-10-CM

## 2024-06-28 DIAGNOSIS — N888 Other specified noninflammatory disorders of cervix uteri: Secondary | ICD-10-CM

## 2024-06-28 DIAGNOSIS — N889 Noninflammatory disorder of cervix uteri, unspecified: Secondary | ICD-10-CM

## 2024-06-28 NOTE — ED Provider Notes (Signed)
 [facility]    CSN: [id number] Arrival date & time: 06/28/24  1452      History   Chief Complaint Chief Complaint  Patient presents with   Shortness of Breath    Chemical inhalation    HPI Victoria Russo is a 48 y.o. female.   HPI 48 year old female presents with shortness of breath since earlier this morning when cleaning her shower.  Spanish interpreter Emil [phone] will help with translation today.  Patient reports while cleaning her shower earlier this morning she added bleach to agent she was cleaning shower with causing increased chemical fumes and shortness of breath.  Patient reports although she was little short of breath decided to take a shower after cleaning shower.  Patient reports while taking shower her shortness of breath worsened so she stopped taking shower and dried off.  PMH significant for obesity, T2DM, and mixed HLD.  Past Medical History:  Diagnosis Date   Bronchitis    Gestational diabetes    UTI (urinary tract infection)     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 11/05/2023   Alkaline phosphatase elevation 11/05/2023   Submental mass 11/05/2023   Hypertriglyceridemia 07/13/2023   History of colon polyps 03/26/2021   Obesity (BMI 30-39.9) 10/07/2017   Obesity in pregnancy 10/07/2017   Language barrier 10/07/2017    Past Surgical History:  Procedure Laterality Date   CESAREAN SECTION     TUBAL LIGATION      OB History     Gravida  4   Para  3   Term  1   Preterm      AB      Living  3      SAB      IAB      Ectopic      Multiple      Live Births  3            Home Medications    Prior to Admission medications   Medication Sig Start Date End  Date Taking? Authorizing Provider  ASHWAGANDHA PO Take by mouth.    [provider]  cetirizine  (ZYRTEC ) 10 MG tablet Take 1 tablet (10 mg total) by mouth daily. 01/17/24   Soldatova, Liuba, MD  famotidine  (PEPCID ) 20 MG tablet Take 1 tablet (20 mg total) by mouth 2 (two) times daily. 01/17/24   Soldatova, Liuba, MD  mometasone  (NASONEX ) 50 MCG/ACT nasal spray Place 2 sprays into the nose daily. 01/17/24   Soldatova, Liuba, MD  sodium chloride (OCEAN) 0.65 % SOLN nasal spray Place 1 spray into both nostrils as needed. 01/17/24   Okey Burns, MD    Family History Family History  Problem Relation Age of Onset   Cancer Mother 90       endometrial   Diabetes Maternal Aunt     Social History Social History   Tobacco Use   Smoking status: Never   Smokeless tobacco: Never  Vaping Use   Vaping status: Never Used  Substance Use Topics   Alcohol use: Yes    Comment: occasionally   Drug use: No     Allergies   Patient has no known allergies.   Review of Systems Review of Systems  Respiratory:  Positive  for cough and shortness of breath.      Physical Exam Triage Vital Signs ED Triage Vitals  Encounter Vitals Group     BP      Girls Systolic BP Percentile      Girls Diastolic BP Percentile      Boys Systolic BP Percentile      Boys Diastolic BP Percentile      Pulse      Resp      Temp      Temp src      SpO2      Weight      Height      Head Circumference      Peak Flow      Pain Score      Pain Loc      Pain Education      Exclude from Growth Chart    No data found.  Updated Vital Signs BP (!) 144/81 (BP Location: Right Arm)   Pulse 91   Temp 99 F (37.2 C) (Oral)   Resp 17   SpO2 92%   Breastfeeding No    Physical Exam Vitals and nursing note reviewed.  Constitutional:      Appearance: Normal appearance. She is obese.  HENT:     Head: Normocephalic and atraumatic.     Right Ear: Tympanic membrane, ear canal and external ear normal.      Left Ear: Tympanic membrane, ear canal and external ear normal.     Mouth/Throat:     Mouth: Mucous membranes are moist.     Pharynx: Oropharynx is clear.  Eyes:     Extraocular Movements: Extraocular movements intact.     Conjunctiva/sclera: Conjunctivae normal.     Pupils: Pupils are equal, round, and reactive to light.  Cardiovascular:     Rate and Rhythm: Normal rate and regular rhythm.     Pulses: Normal pulses.     Heart sounds: Normal heart sounds.  Pulmonary:     Effort: Pulmonary effort is normal.     Breath sounds: Normal breath sounds. No wheezing, rhonchi or rales.  Musculoskeletal:        General: Normal range of motion.  Skin:    General: Skin is warm and dry.  Neurological:     General: No focal deficit present.     Mental Status: She is alert and oriented to person, place, and time. Mental status is at baseline.  Psychiatric:        Mood and Affect: Mood normal.        Behavior: Behavior normal.      UC Treatments / Results  Labs (all labs ordered are listed, but only abnormal results are displayed) Labs Reviewed - No data to display  EKG   Radiology No results found.  Procedures Procedures (including critical care time)  Medications Ordered in UC Medications - No data to display  Initial Impression / Assessment and Plan / UC Course  I have reviewed the triage vital signs and the nursing notes.  Pertinent labs & imaging results that were available during my care of the patient were reviewed by me and considered in my medical decision making (see chart for details).     MDM: 1.  Shortness of breath-Advised patient to avoid mixing chemicals in bathroom when cleaning a bathtub or shower.  Advised area to be free of chemicals and/or smell of chemicals prior to showering in area just cleaned.  Advised if symptoms worsen and/or unresolved please  follow-up with your PCP or here for further evaluation.  Patient discharged home, hemodynamically  stable. Final Clinical Impressions(s) / UC Diagnoses   Final diagnoses:  SOB (shortness of breath)     Discharge Instructions      Advised patient to avoid mixing chemicals in bathroom when cleaning a bathtub or shower.  Advised area to be free of chemicals and/or smell of chemicals prior to showering in area just cleaned.  Advised if symptoms worsen and/or unresolved please follow-up with your PCP or here for further evaluation.     ED Prescriptions   None    PDMP not reviewed this encounter.   Teddy Sharper, FNP 06/28/24 1530

## 2024-06-28 NOTE — ED Triage Notes (Signed)
 Pt here today for shortness of breath due to chemical inhalation. (Chlorine) Tried to drink milk and used inhaler with no relief. Short of breath with exertion.

## 2024-06-28 NOTE — Discharge Instructions (Addendum)
 Advised patient to avoid mixing chemicals in bathroom when cleaning a bathtub or shower.  Advised area to be free of chemicals and/or smell of chemicals prior to showering in area just cleaned.  Advised if symptoms worsen and/or unresolved please follow-up with your PCP or here for further evaluation.

## 2024-06-29 ENCOUNTER — Encounter: Payer: Self-pay | Admitting: Urgent Care

## 2024-06-29 ENCOUNTER — Telehealth: Payer: Self-pay

## 2024-06-29 ENCOUNTER — Other Ambulatory Visit: Payer: Self-pay

## 2024-06-29 DIAGNOSIS — N644 Mastodynia: Secondary | ICD-10-CM

## 2024-06-29 DIAGNOSIS — N63 Unspecified lump in unspecified breast: Secondary | ICD-10-CM

## 2024-06-29 NOTE — Telephone Encounter (Signed)
 Call made to pt to check on status since UC visit. Pt states no improvement since last night. Advised she should f/u with PCP or be further evaluated at the Emergency Dept. Pt verbalized understanding.

## 2024-07-10 NOTE — Telephone Encounter (Signed)
 Note reads according to google translates Hello Dr. Lowella, I have an appointment with you on September 17th, right?

## 2024-07-19 ENCOUNTER — Encounter: Payer: Self-pay | Admitting: Obstetrics and Gynecology

## 2024-07-19 ENCOUNTER — Ambulatory Visit (INDEPENDENT_AMBULATORY_CARE_PROVIDER_SITE_OTHER): Payer: Self-pay | Admitting: Obstetrics and Gynecology

## 2024-07-19 VITALS — BP 113/73 | HR 75 | Ht 61.0 in | Wt 192.0 lb

## 2024-07-19 DIAGNOSIS — N888 Other specified noninflammatory disorders of cervix uteri: Secondary | ICD-10-CM

## 2024-07-19 DIAGNOSIS — R102 Pelvic and perineal pain: Secondary | ICD-10-CM

## 2024-07-19 DIAGNOSIS — N951 Menopausal and female climacteric states: Secondary | ICD-10-CM

## 2024-07-19 DIAGNOSIS — M6289 Other specified disorders of muscle: Secondary | ICD-10-CM

## 2024-07-19 NOTE — Addendum Note (Signed)
 Addended by: CLEATUS MOCCASIN A on: 07/19/2024 04:50 PM   Modules accepted: Orders

## 2024-07-19 NOTE — Progress Notes (Signed)
 GYNECOLOGY OFFICE VISIT NOTE  History:  Victoria Russo is a 48 y.o. [phone] here today for follow up from PCP office for a couple concerns:  Cervical mass/growth seen on exam. Pap was normal and HPV negative. She had a pelvic US  which was normal. Patient reported abnormal pap 15 years ago but normal since. She reported [facility] of cervical cancer in her mother and aunt.  Pelvic pain She also notes 1.5 months ago that she experienced fecal urgency and SUI. It has since resolved.  She reports mutliple symptoms of perimenopause - low libido, hot flashes especially at night, and changes to her cycle. They are heavy for a couple days but only last about 5 days. They have some variability to it but she does get them about every 4-6 weeks.  Her husband was also diagnosed with low hormones but is being treated with his PCP.   I reviewed her US  images myself. They show overall normal uterus with possible small fibroid. She has normal appearing ovaries. Cervix was normal in appearance, she had a 1.5x1 cm cyst in the cervix that was hyperechoic and likely a nabothian cyst on the posterior aspect of the cervix.    Past Medical History:  Diagnosis Date   Bronchitis    Gestational diabetes    UTI (urinary tract infection)     Past Surgical History:  Procedure Laterality Date   CESAREAN SECTION     TUBAL LIGATION      The following portions of the patient's history were reviewed and updated as appropriate: allergies, current medications, past family history, past medical history, past social history, past surgical history and problem list.   Health Maintenance:   Normal pap and negative HRHPV : Diagnosis  Date Value Ref Range Status  06/23/2024   Final   - Negative for intraepithelial lesion or malignancy (NILM)     Review of Systems:  Pertinent items noted in HPI and remainder of comprehensive ROS otherwise negative.  Physical Exam:  BP 113/73 (BP Location: Right Arm, Patient  Position: Sitting, Cuff Size: Normal)   Pulse 75   Ht 5' 1 (1.549 m)   Wt 192 lb (87.1 kg)   BMI 36.28 kg/m  CONSTITUTIONAL: Well-developed, well-nourished female in no acute distress.  HEENT:  Normocephalic, atraumatic. External right and left ear normal. No scleral icterus.  NECK: Normal range of motion, supple, no masses noted on observation SKIN: No rash noted. Not diaphoretic. No erythema. No pallor. MUSCULOSKELETAL: Normal range of motion. No edema noted. NEUROLOGIC: Alert and oriented to person, place, and time. Normal muscle tone coordination. No cranial nerve deficit noted. PSYCHIATRIC: Normal mood and affect. Normal behavior. Normal judgment and thought content.   PELVIC: Normal appearing external genitalia; normal urethral meatus; normal appearing vaginal mucosa and cervix - cervix with multiple nabothian cysts - no masses noted and no areas of concern. Cervix of normal consistency. No abnormal discharge noted.  Normal uterine size, no other palpable masses, no uterine or adnexal tenderness. Muscles of pelvic floor were diffusely tender and c/w hypertonus. Performed in the presence of a chaperone  Labs and Imaging No results found for this or any previous visit (from the past week). US  PELVIC COMPLETE WITH TRANSVAGINAL Result Date: 07/09/2024 CLINICAL DATA:  Family history of ovarian cancer.  Pelvic pain. EXAM: TRANSABDOMINAL AND TRANSVAGINAL ULTRASOUND OF PELVIS TECHNIQUE: Both transabdominal and transvaginal ultrasound examinations of the pelvis were performed. Transabdominal technique was performed for global imaging of the pelvis including uterus, ovaries, adnexal regions,  and pelvic cul-de-sac. It was necessary to proceed with endovaginal exam following the transabdominal exam to visualize the uterus, endometrium, ovaries and adnexa. COMPARISON:  10/20/2017 FINDINGS: Uterus Measurements: 10.7 x 5.6 x 7.1 cm = volume: 220 mL. 1 cm hypoechoic area within the right fundus may reflect  a small intramural fibroid. Endometrium Thickness: Normal thickness, 8 mm.  No focal abnormality visualized. Right ovary Measurements: 3.9 x 2.4 x 2.7 cm = volume: 13 mL. Normal appearance/no adnexal mass. Left ovary Measurements: Not visualized.  No adnexal mass seen. Other findings No abnormal free fluid. IMPRESSION: No acute findings. Small 1 cm right intramural fibroid. No ovarian/adnexal mass. Electronically Signed   By: Franky Crease M.D.   On: 07/09/2024 20:37    Assessment and Plan:  [name] was seen today for gyn problem.  Diagnoses and all orders for this visit:  Cervical mass No evidence of mass today. US  was normal in appearance. EL normal 8mm. I reviewed this with her and have reviewed the images myself and agree with the radiology report.  Pap and HPV recently normal.   Pelvic floor dysfunction PFPT would be very beneficial for her to help with this and prevent continuing SUI/fecal urgency.  -     Ambulatory referral to Physical Therapy  Pelvic pain -     Ambulatory referral to Physical Therapy  Perimenopause Discussed common symptoms of menopause. Reviewed treatment not required but possible depending on how they affect her QOL. If she would like intervention, I encouraged her to routine to see me.     Routine preventative health maintenance measures emphasized. Please refer to After Visit Summary for other counseling recommendations.   Return if symptoms worsen or fail to improve.  Vina Solian, MD, FACOG Obstetrician & Gynecologist, [facility] for [facility], [facility]

## 2024-07-23 LAB — URINE CULTURE

## 2024-07-24 ENCOUNTER — Ambulatory Visit: Payer: Self-pay | Admitting: Obstetrics and Gynecology

## 2024-07-24 DIAGNOSIS — N390 Urinary tract infection, site not specified: Secondary | ICD-10-CM

## 2024-07-24 MED ORDER — CEFADROXIL 500 MG PO CAPS: 500.0000 mg | ORAL_CAPSULE | Freq: Two times a day (BID) | ORAL | 0 refills | Status: AC

## 2024-07-25 ENCOUNTER — Telehealth: Payer: Self-pay | Admitting: *Deleted

## 2024-07-25 NOTE — Telephone Encounter (Signed)
 Called Interpreter line and attempted to get in touch with patient, no answer.

## 2024-07-25 NOTE — Telephone Encounter (Signed)
 Returned call with language line from 2:51 PM. Left patient a message to call and schedule.

## 2024-08-07 ENCOUNTER — Encounter: Payer: Self-pay | Admitting: Obstetrics and Gynecology

## 2024-08-10 ENCOUNTER — Other Ambulatory Visit: Payer: Self-pay | Admitting: Urgent Care

## 2024-08-10 ENCOUNTER — Ambulatory Visit: Payer: Self-pay

## 2024-08-10 ENCOUNTER — Ambulatory Visit
Admission: RE | Admit: 2024-08-10 | Discharge: 2024-08-10 | Disposition: A | Source: Ambulatory Visit | Attending: Urgent Care | Admitting: Urgent Care

## 2024-08-10 DIAGNOSIS — N644 Mastodynia: Secondary | ICD-10-CM

## 2024-08-10 NOTE — Progress Notes (Signed)
 Received phone call from imaging center stating mammo and US  orders had to come from PCP and could not proceed with current orders. New order placed

## 2024-08-22 ENCOUNTER — Ambulatory Visit: Payer: Self-pay

## 2024-08-22 ENCOUNTER — Ambulatory Visit
Admission: RE | Admit: 2024-08-22 | Discharge: 2024-08-22 | Disposition: A | Source: Ambulatory Visit | Attending: Urgent Care | Admitting: Urgent Care

## 2024-08-22 ENCOUNTER — Ambulatory Visit: Payer: Self-pay | Admitting: Urgent Care

## 2024-09-11 ENCOUNTER — Ambulatory Visit: Payer: Self-pay
